# Patient Record
Sex: Female | Born: 1940 | Race: White | Hispanic: No | Smoking: Never smoker
Health system: Southern US, Community
[De-identification: ages and names within clinical notes are randomized; demographics above are authoritative.]

## PROBLEM LIST (undated history)

## (undated) DIAGNOSIS — I471 Supraventricular tachycardia: Secondary | ICD-10-CM

## (undated) DIAGNOSIS — I493 Ventricular premature depolarization: Secondary | ICD-10-CM

## (undated) DIAGNOSIS — M810 Age-related osteoporosis without current pathological fracture: Secondary | ICD-10-CM

## (undated) DIAGNOSIS — H409 Unspecified glaucoma: Secondary | ICD-10-CM

## (undated) DIAGNOSIS — E46 Unspecified protein-calorie malnutrition: Secondary | ICD-10-CM

## (undated) DIAGNOSIS — J309 Allergic rhinitis, unspecified: Secondary | ICD-10-CM

## (undated) DIAGNOSIS — I4719 Other supraventricular tachycardia: Secondary | ICD-10-CM

## (undated) DIAGNOSIS — E119 Type 2 diabetes mellitus without complications: Secondary | ICD-10-CM

## (undated) DIAGNOSIS — M1990 Unspecified osteoarthritis, unspecified site: Secondary | ICD-10-CM

## (undated) DIAGNOSIS — J189 Pneumonia, unspecified organism: Secondary | ICD-10-CM

## (undated) HISTORY — DX: Ventricular premature depolarization: I49.3

## (undated) HISTORY — DX: Age-related osteoporosis without current pathological fracture: M81.0

## (undated) HISTORY — DX: Other supraventricular tachycardia: I47.19

## (undated) HISTORY — DX: Type 2 diabetes mellitus without complications: E11.9

## (undated) HISTORY — PX: TONSILLECTOMY: SUR1361

## (undated) HISTORY — PX: OTHER SURGICAL HISTORY: SHX169

## (undated) HISTORY — DX: Unspecified protein-calorie malnutrition: E46

## (undated) HISTORY — PX: ABDOMINAL HYSTERECTOMY: SHX81

## (undated) HISTORY — DX: Supraventricular tachycardia: I47.1

## (undated) HISTORY — PX: APPENDECTOMY: SHX54

## (undated) HISTORY — PX: PARATHYROIDECTOMY: SHX19

## (undated) HISTORY — DX: Unspecified glaucoma: H40.9

## (undated) HISTORY — DX: Allergic rhinitis, unspecified: J30.9

## (undated) HISTORY — PX: EYE SURGERY: SHX253

---

## 2014-06-18 DIAGNOSIS — E1065 Type 1 diabetes mellitus with hyperglycemia: Secondary | ICD-10-CM | POA: Diagnosis not present

## 2014-06-18 DIAGNOSIS — E780 Pure hypercholesterolemia: Secondary | ICD-10-CM | POA: Diagnosis not present

## 2014-06-18 DIAGNOSIS — E10649 Type 1 diabetes mellitus with hypoglycemia without coma: Secondary | ICD-10-CM | POA: Diagnosis not present

## 2014-06-18 DIAGNOSIS — E1021 Type 1 diabetes mellitus with diabetic nephropathy: Secondary | ICD-10-CM | POA: Diagnosis not present

## 2014-06-18 DIAGNOSIS — Z8639 Personal history of other endocrine, nutritional and metabolic disease: Secondary | ICD-10-CM | POA: Diagnosis not present

## 2014-06-18 DIAGNOSIS — E10319 Type 1 diabetes mellitus with unspecified diabetic retinopathy without macular edema: Secondary | ICD-10-CM | POA: Diagnosis not present

## 2014-07-20 DIAGNOSIS — H04123 Dry eye syndrome of bilateral lacrimal glands: Secondary | ICD-10-CM | POA: Diagnosis not present

## 2014-07-20 DIAGNOSIS — H2513 Age-related nuclear cataract, bilateral: Secondary | ICD-10-CM | POA: Diagnosis not present

## 2014-07-20 DIAGNOSIS — H43813 Vitreous degeneration, bilateral: Secondary | ICD-10-CM | POA: Diagnosis not present

## 2014-07-20 DIAGNOSIS — H18413 Arcus senilis, bilateral: Secondary | ICD-10-CM | POA: Diagnosis not present

## 2014-07-20 DIAGNOSIS — E10329 Type 1 diabetes mellitus with mild nonproliferative diabetic retinopathy without macular edema: Secondary | ICD-10-CM | POA: Diagnosis not present

## 2014-09-07 DIAGNOSIS — E1065 Type 1 diabetes mellitus with hyperglycemia: Secondary | ICD-10-CM | POA: Diagnosis not present

## 2014-09-21 DIAGNOSIS — E10319 Type 1 diabetes mellitus with unspecified diabetic retinopathy without macular edema: Secondary | ICD-10-CM | POA: Diagnosis not present

## 2014-09-21 DIAGNOSIS — E10649 Type 1 diabetes mellitus with hypoglycemia without coma: Secondary | ICD-10-CM | POA: Diagnosis not present

## 2014-09-21 DIAGNOSIS — E1065 Type 1 diabetes mellitus with hyperglycemia: Secondary | ICD-10-CM | POA: Diagnosis not present

## 2014-09-21 DIAGNOSIS — E780 Pure hypercholesterolemia: Secondary | ICD-10-CM | POA: Diagnosis not present

## 2014-09-21 DIAGNOSIS — E1021 Type 1 diabetes mellitus with diabetic nephropathy: Secondary | ICD-10-CM | POA: Diagnosis not present

## 2014-09-21 DIAGNOSIS — Z8639 Personal history of other endocrine, nutritional and metabolic disease: Secondary | ICD-10-CM | POA: Diagnosis not present

## 2014-09-30 DIAGNOSIS — N183 Chronic kidney disease, stage 3 (moderate): Secondary | ICD-10-CM | POA: Diagnosis not present

## 2014-09-30 DIAGNOSIS — Z1239 Encounter for other screening for malignant neoplasm of breast: Secondary | ICD-10-CM | POA: Diagnosis not present

## 2014-09-30 DIAGNOSIS — E780 Pure hypercholesterolemia: Secondary | ICD-10-CM | POA: Diagnosis not present

## 2014-09-30 DIAGNOSIS — E1021 Type 1 diabetes mellitus with diabetic nephropathy: Secondary | ICD-10-CM | POA: Diagnosis not present

## 2014-09-30 DIAGNOSIS — Z8639 Personal history of other endocrine, nutritional and metabolic disease: Secondary | ICD-10-CM | POA: Diagnosis not present

## 2014-10-01 DIAGNOSIS — M810 Age-related osteoporosis without current pathological fracture: Secondary | ICD-10-CM | POA: Diagnosis not present

## 2014-10-01 DIAGNOSIS — Z8639 Personal history of other endocrine, nutritional and metabolic disease: Secondary | ICD-10-CM | POA: Diagnosis not present

## 2014-10-01 DIAGNOSIS — E780 Pure hypercholesterolemia: Secondary | ICD-10-CM | POA: Diagnosis not present

## 2014-10-20 DIAGNOSIS — M810 Age-related osteoporosis without current pathological fracture: Secondary | ICD-10-CM | POA: Diagnosis not present

## 2014-10-20 DIAGNOSIS — Z7983 Long term (current) use of bisphosphonates: Secondary | ICD-10-CM | POA: Diagnosis not present

## 2014-10-20 DIAGNOSIS — Z5181 Encounter for therapeutic drug level monitoring: Secondary | ICD-10-CM | POA: Diagnosis not present

## 2014-10-27 DIAGNOSIS — M810 Age-related osteoporosis without current pathological fracture: Secondary | ICD-10-CM | POA: Diagnosis not present

## 2014-10-27 DIAGNOSIS — Z889 Allergy status to unspecified drugs, medicaments and biological substances status: Secondary | ICD-10-CM | POA: Diagnosis not present

## 2014-11-24 DIAGNOSIS — Z1231 Encounter for screening mammogram for malignant neoplasm of breast: Secondary | ICD-10-CM | POA: Diagnosis not present

## 2014-11-24 DIAGNOSIS — Z9189 Other specified personal risk factors, not elsewhere classified: Secondary | ICD-10-CM | POA: Diagnosis not present

## 2014-11-24 DIAGNOSIS — Z780 Asymptomatic menopausal state: Secondary | ICD-10-CM | POA: Diagnosis not present

## 2014-12-28 DIAGNOSIS — E10649 Type 1 diabetes mellitus with hypoglycemia without coma: Secondary | ICD-10-CM | POA: Diagnosis not present

## 2014-12-28 DIAGNOSIS — Z8639 Personal history of other endocrine, nutritional and metabolic disease: Secondary | ICD-10-CM | POA: Diagnosis not present

## 2014-12-28 DIAGNOSIS — E1065 Type 1 diabetes mellitus with hyperglycemia: Secondary | ICD-10-CM | POA: Diagnosis not present

## 2014-12-28 DIAGNOSIS — E1021 Type 1 diabetes mellitus with diabetic nephropathy: Secondary | ICD-10-CM | POA: Diagnosis not present

## 2014-12-28 DIAGNOSIS — E10329 Type 1 diabetes mellitus with mild nonproliferative diabetic retinopathy without macular edema: Secondary | ICD-10-CM | POA: Diagnosis not present

## 2014-12-28 DIAGNOSIS — E780 Pure hypercholesterolemia: Secondary | ICD-10-CM | POA: Diagnosis not present

## 2015-01-04 DIAGNOSIS — H2513 Age-related nuclear cataract, bilateral: Secondary | ICD-10-CM | POA: Diagnosis not present

## 2015-01-04 DIAGNOSIS — E10329 Type 1 diabetes mellitus with mild nonproliferative diabetic retinopathy without macular edema: Secondary | ICD-10-CM | POA: Diagnosis not present

## 2015-01-04 DIAGNOSIS — H52223 Regular astigmatism, bilateral: Secondary | ICD-10-CM | POA: Diagnosis not present

## 2015-01-04 DIAGNOSIS — H04123 Dry eye syndrome of bilateral lacrimal glands: Secondary | ICD-10-CM | POA: Diagnosis not present

## 2015-03-30 DIAGNOSIS — E7800 Pure hypercholesterolemia, unspecified: Secondary | ICD-10-CM | POA: Diagnosis not present

## 2015-03-30 DIAGNOSIS — Z8639 Personal history of other endocrine, nutritional and metabolic disease: Secondary | ICD-10-CM | POA: Diagnosis not present

## 2015-03-30 DIAGNOSIS — E103293 Type 1 diabetes mellitus with mild nonproliferative diabetic retinopathy without macular edema, bilateral: Secondary | ICD-10-CM | POA: Diagnosis not present

## 2015-03-30 DIAGNOSIS — E1065 Type 1 diabetes mellitus with hyperglycemia: Secondary | ICD-10-CM | POA: Diagnosis not present

## 2015-03-30 DIAGNOSIS — E1021 Type 1 diabetes mellitus with diabetic nephropathy: Secondary | ICD-10-CM | POA: Diagnosis not present

## 2015-03-30 DIAGNOSIS — E10649 Type 1 diabetes mellitus with hypoglycemia without coma: Secondary | ICD-10-CM | POA: Diagnosis not present

## 2015-04-05 DIAGNOSIS — Z8639 Personal history of other endocrine, nutritional and metabolic disease: Secondary | ICD-10-CM | POA: Diagnosis not present

## 2015-04-05 DIAGNOSIS — N183 Chronic kidney disease, stage 3 (moderate): Secondary | ICD-10-CM | POA: Diagnosis not present

## 2015-04-05 DIAGNOSIS — E7800 Pure hypercholesterolemia, unspecified: Secondary | ICD-10-CM | POA: Diagnosis not present

## 2015-04-05 DIAGNOSIS — M810 Age-related osteoporosis without current pathological fracture: Secondary | ICD-10-CM | POA: Diagnosis not present

## 2015-04-05 DIAGNOSIS — E1021 Type 1 diabetes mellitus with diabetic nephropathy: Secondary | ICD-10-CM | POA: Diagnosis not present

## 2015-07-02 DIAGNOSIS — E7800 Pure hypercholesterolemia, unspecified: Secondary | ICD-10-CM | POA: Diagnosis not present

## 2015-07-02 DIAGNOSIS — E1021 Type 1 diabetes mellitus with diabetic nephropathy: Secondary | ICD-10-CM | POA: Diagnosis not present

## 2015-07-02 DIAGNOSIS — E10649 Type 1 diabetes mellitus with hypoglycemia without coma: Secondary | ICD-10-CM | POA: Diagnosis not present

## 2015-07-02 DIAGNOSIS — E1065 Type 1 diabetes mellitus with hyperglycemia: Secondary | ICD-10-CM | POA: Diagnosis not present

## 2015-07-02 DIAGNOSIS — Z8639 Personal history of other endocrine, nutritional and metabolic disease: Secondary | ICD-10-CM | POA: Diagnosis not present

## 2015-07-02 DIAGNOSIS — E103293 Type 1 diabetes mellitus with mild nonproliferative diabetic retinopathy without macular edema, bilateral: Secondary | ICD-10-CM | POA: Diagnosis not present

## 2015-10-04 DIAGNOSIS — N183 Chronic kidney disease, stage 3 (moderate): Secondary | ICD-10-CM | POA: Diagnosis not present

## 2015-10-04 DIAGNOSIS — M810 Age-related osteoporosis without current pathological fracture: Secondary | ICD-10-CM | POA: Diagnosis not present

## 2015-10-04 DIAGNOSIS — E7800 Pure hypercholesterolemia, unspecified: Secondary | ICD-10-CM | POA: Diagnosis not present

## 2015-10-04 DIAGNOSIS — E103293 Type 1 diabetes mellitus with mild nonproliferative diabetic retinopathy without macular edema, bilateral: Secondary | ICD-10-CM | POA: Diagnosis not present

## 2015-10-04 DIAGNOSIS — E1021 Type 1 diabetes mellitus with diabetic nephropathy: Secondary | ICD-10-CM | POA: Diagnosis not present

## 2015-10-04 DIAGNOSIS — Z8639 Personal history of other endocrine, nutritional and metabolic disease: Secondary | ICD-10-CM | POA: Diagnosis not present

## 2015-10-05 DIAGNOSIS — Z8639 Personal history of other endocrine, nutritional and metabolic disease: Secondary | ICD-10-CM | POA: Diagnosis not present

## 2015-10-05 DIAGNOSIS — E7800 Pure hypercholesterolemia, unspecified: Secondary | ICD-10-CM | POA: Diagnosis not present

## 2015-10-05 DIAGNOSIS — E1065 Type 1 diabetes mellitus with hyperglycemia: Secondary | ICD-10-CM | POA: Diagnosis not present

## 2015-10-05 DIAGNOSIS — E10649 Type 1 diabetes mellitus with hypoglycemia without coma: Secondary | ICD-10-CM | POA: Diagnosis not present

## 2015-10-05 DIAGNOSIS — E103293 Type 1 diabetes mellitus with mild nonproliferative diabetic retinopathy without macular edema, bilateral: Secondary | ICD-10-CM | POA: Diagnosis not present

## 2015-10-05 DIAGNOSIS — E1021 Type 1 diabetes mellitus with diabetic nephropathy: Secondary | ICD-10-CM | POA: Diagnosis not present

## 2015-12-24 DIAGNOSIS — E109 Type 1 diabetes mellitus without complications: Secondary | ICD-10-CM | POA: Diagnosis not present

## 2016-01-05 DIAGNOSIS — H40033 Anatomical narrow angle, bilateral: Secondary | ICD-10-CM | POA: Diagnosis not present

## 2016-01-05 DIAGNOSIS — H2513 Age-related nuclear cataract, bilateral: Secondary | ICD-10-CM | POA: Diagnosis not present

## 2016-01-17 DIAGNOSIS — H40033 Anatomical narrow angle, bilateral: Secondary | ICD-10-CM | POA: Diagnosis not present

## 2016-01-23 ENCOUNTER — Emergency Department (HOSPITAL_COMMUNITY)
Admission: EM | Admit: 2016-01-23 | Discharge: 2016-01-24 | Disposition: A | Discharge status: Home / Self Care | Payer: Medicare Other | Attending: Emergency Medicine | Admitting: Emergency Medicine

## 2016-01-23 ENCOUNTER — Emergency Department (HOSPITAL_COMMUNITY): Payer: Medicare Other

## 2016-01-23 ENCOUNTER — Encounter (HOSPITAL_COMMUNITY): Payer: Self-pay | Admitting: Emergency Medicine

## 2016-01-23 DIAGNOSIS — Y929 Unspecified place or not applicable: Secondary | ICD-10-CM | POA: Insufficient documentation

## 2016-01-23 DIAGNOSIS — Z23 Encounter for immunization: Secondary | ICD-10-CM | POA: Diagnosis not present

## 2016-01-23 DIAGNOSIS — W01198A Fall on same level from slipping, tripping and stumbling with subsequent striking against other object, initial encounter: Secondary | ICD-10-CM | POA: Diagnosis not present

## 2016-01-23 DIAGNOSIS — S62613A Displaced fracture of proximal phalanx of left middle finger, initial encounter for closed fracture: Secondary | ICD-10-CM | POA: Diagnosis not present

## 2016-01-23 DIAGNOSIS — S82034A Nondisplaced transverse fracture of right patella, initial encounter for closed fracture: Secondary | ICD-10-CM | POA: Diagnosis not present

## 2016-01-23 DIAGNOSIS — Y999 Unspecified external cause status: Secondary | ICD-10-CM | POA: Insufficient documentation

## 2016-01-23 DIAGNOSIS — Y9301 Activity, walking, marching and hiking: Secondary | ICD-10-CM | POA: Diagnosis not present

## 2016-01-23 DIAGNOSIS — S0081XA Abrasion of other part of head, initial encounter: Secondary | ICD-10-CM | POA: Diagnosis not present

## 2016-01-23 DIAGNOSIS — S82001A Unspecified fracture of right patella, initial encounter for closed fracture: Secondary | ICD-10-CM

## 2016-01-23 DIAGNOSIS — Z794 Long term (current) use of insulin: Secondary | ICD-10-CM | POA: Diagnosis not present

## 2016-01-23 DIAGNOSIS — E119 Type 2 diabetes mellitus without complications: Secondary | ICD-10-CM | POA: Insufficient documentation

## 2016-01-23 DIAGNOSIS — S62639A Displaced fracture of distal phalanx of unspecified finger, initial encounter for closed fracture: Secondary | ICD-10-CM

## 2016-01-23 DIAGNOSIS — S8991XA Unspecified injury of right lower leg, initial encounter: Secondary | ICD-10-CM | POA: Diagnosis present

## 2016-01-23 DIAGNOSIS — M25562 Pain in left knee: Secondary | ICD-10-CM | POA: Diagnosis not present

## 2016-01-23 DIAGNOSIS — S82091A Other fracture of right patella, initial encounter for closed fracture: Secondary | ICD-10-CM | POA: Diagnosis not present

## 2016-01-23 DIAGNOSIS — S0990XA Unspecified injury of head, initial encounter: Secondary | ICD-10-CM | POA: Diagnosis not present

## 2016-01-23 DIAGNOSIS — T148 Other injury of unspecified body region: Secondary | ICD-10-CM | POA: Diagnosis not present

## 2016-01-23 HISTORY — DX: Type 2 diabetes mellitus without complications: E11.9

## 2016-01-23 MED ORDER — MORPHINE SULFATE (PF) 2 MG/ML IV SOLN: 2.0000 mg | Freq: Once | INTRAVENOUS | Status: DC

## 2016-01-23 MED ORDER — MORPHINE SULFATE (PF) 4 MG/ML IV SOLN
4.0000 mg | Freq: Once | INTRAVENOUS | Status: AC
  Administered 2016-01-23: 4 mg via INTRAMUSCULAR
  Filled 2016-01-23: qty 1

## 2016-01-23 MED ORDER — TETANUS-DIPHTH-ACELL PERTUSSIS 5-2.5-18.5 LF-MCG/0.5 IM SUSP
0.5000 mL | Freq: Once | INTRAMUSCULAR | Status: AC
  Administered 2016-01-23: 0.5 mL via INTRAMUSCULAR
  Filled 2016-01-23: qty 0.5

## 2016-01-23 NOTE — ED Triage Notes (Signed)
Pt was walking dog and tripped. Striking both knees hand. Right knee swollen and immobilized. Abrasions to left knee and left eye

## 2016-01-23 NOTE — ED Notes (Signed)
Ortho tech paged  

## 2016-01-23 NOTE — ED Notes (Signed)
[employer] paged for knee brace

## 2016-01-23 NOTE — ED Provider Notes (Signed)
[facility] DEPT Provider Note   CSN: [id number] Arrival date & time: 01/23/16  2018     History   Chief Complaint Chief Complaint  Patient presents with  . Fall    HPI Kendra Harper is a 75 y.o. female.  HPI  Past Medical History:  Diagnosis Date  . Diabetes mellitus without complication ([place])     There are no active problems to display for this patient.   Past Surgical History:  Procedure Laterality Date  . ABDOMINAL HYSTERECTOMY    . APPENDECTOMY    . EYE SURGERY    . heel surgery Bilateral   . TONSILLECTOMY      OB History    No data available       Home Medications    Prior to Admission medications   Medication Sig Start Date End Date Taking? Authorizing Provider  Insulin Human (INSULIN PUMP) SOLN Inject into the skin daily. 10/1 carb ratio  humalog insulin   Yes Historical Provider, MD  HYDROcodone-acetaminophen (NORCO/VICODIN) 5-325 MG tablet Take 1 tablet by mouth every 4 (four) hours as needed. 01/24/16   Dorie Rank, MD    Family History No family history on file.  Social History Social History  Substance Use Topics  . Smoking status: Never Smoker  . Smokeless tobacco: Never Used  . Alcohol use No     Allergies   Lyrica [pregabalin]   Review of Systems Review of Systems  Constitutional: Negative for chills and fever.  Respiratory: Negative for shortness of breath.   Cardiovascular: Negative for chest pain.  Gastrointestinal: Negative for nausea and vomiting.  Musculoskeletal: Positive for joint swelling. Negative for back pain, neck pain and neck stiffness.  Skin: Positive for wound.  Neurological: Negative for dizziness, weakness, light-headedness and numbness.  All other systems reviewed and are negative.    Physical Exam Updated Vital Signs BP 124/85 (BP Location: Right Arm)   Pulse 74   Temp 98.8 F (37.1 C) (Oral)   Resp 16   Ht 5\' 1"  (1.549 m)   Wt 45.8 kg   SpO2 99%   BMI 19.08 kg/m   Physical Exam    Constitutional: She appears well-developed and well-nourished. No distress.  HENT:  Head: Normocephalic and atraumatic.  Eyes: Conjunctivae are normal.  Neck: Neck supple.  Cardiovascular: Normal rate and regular rhythm.   No murmur heard. Pulmonary/Chest: Effort normal and breath sounds normal. No respiratory distress.  Abdominal: Soft. There is no tenderness.  Musculoskeletal: Normal range of motion. She exhibits edema, tenderness and deformity.  Neurological: She is alert. No cranial nerve deficit.  Skin: Skin is warm and dry.  Psychiatric: She has a normal mood and affect.  Nursing note and vitals reviewed.    ED Treatments / Results  Labs (all labs ordered are listed, but only abnormal results are displayed) Labs Reviewed - No data to display  EKG  EKG Interpretation None       Radiology Ct Head Wo Contrast  Result Date: 01/23/2016 CLINICAL DATA:  [place] while walking the dog this evening, abrasion to LEFT side of temple, no loss of consciousness, history diabetes mellitus EXAM: CT HEAD WITHOUT CONTRAST TECHNIQUE: Contiguous axial images were obtained from the base of the skull through the vertex without intravenous contrast. COMPARISON:  None FINDINGS: Age-related atrophy. Normal ventricular morphology. No midline shift or mass effect. Otherwise normal appearance of brain parenchyma. No intracranial hemorrhage, mass lesion, evidence of acute infarction or extra-axial fluid collection. Bones and sinuses unremarkable. IMPRESSION: No  acute intracranial abnormalities. Electronically Signed   By: Lavonia Dana M.D.   On: 01/23/2016 23:26   Dg Knee Complete 4 Views Right  Result Date: 01/23/2016 CLINICAL DATA:  [place] this evening, pain in RIGHT knee at patella, initial encounter EXAM: RIGHT KNEE - COMPLETE 4+ VIEW COMPARISON:  Non FINDINGS: Diffuse osseous demineralization. Joint spaces preserved. Transverse nondisplaced fracture RIGHT patella. Associated knee joint effusion and  prepatellar soft tissue swelling. No additional fracture, dislocation, or bone destruction. IMPRESSION: Transverse nondisplaced fracture of the RIGHT patella. Electronically Signed   By: Lavonia Dana M.D.   On: 01/23/2016 22:24   Dg Finger Middle Left  Result Date: 01/23/2016 CLINICAL DATA:  Pain in LEFT middle finger post fall EXAM: LEFT MIDDLE FINGER 2+V COMPARISON:  None FINDINGS: Osseous demineralization. Minimal narrowing of IP joints. Oblique fracture through shaft of proximal phalanx LEFT middle finger, not significantly displaced. No articular extension. No additional fracture or dislocation. IMPRESSION: Oblique fracture at shaft of proximal phalanx LEFT middle finger. Electronically Signed   By: Lavonia Dana M.D.   On: 01/23/2016 23:39    Procedures Procedures (including critical care time)  Medications Ordered in ED Medications  morphine 4 MG/ML injection 4 mg (4 mg Intramuscular Given 01/23/16 2250)  Tdap (BOOSTRIX) injection 0.5 mL (0.5 mLs Intramuscular Given 01/23/16 2341)     Initial Impression / Assessment and Plan / ED Course  I have reviewed the triage vital signs and the nursing notes.  Pertinent labs & imaging results that were available during my care of the patient were reviewed by me and considered in my medical decision making (see chart for details).  Clinical Course    Patient is a 75 year old female past medical history of diabetes who comes in today after a mechanical fall. Patient was walking her dog down the sidewalk when the dog ran in front of her causing her to lose her footing and fell forward. Patient complains of right knee pain left middle finger pain as well as an abrasion to the left lateral eyebrow. Patient denies changes in vision headache nausea vomiting loss of consciousness. Patient is ambulatory following the event.  Physical exam patient with obvious swelling and deformity to the right knee. Pt w/ full ROM. Patient with swelling to the PIP joint of  the middle finger of the left hand. Patient has an old injury which has resulted in the diversion of the DIP joint however this is not acute. Pt neurovascularly intact. Patient has a small abrasion to the lateral aspect of the left eyebrow. Remainder physical exam the normal limits.  We'll collect x-ray of the knee as well as the finger and CT head. Pt found to have finger and patella fx. Pt placed in knee brace and given appropriate f/u instructions with ortho. Pt agreeable to discharge at this time.    Final Clinical Impressions(s) / ED Diagnoses   Final diagnoses:  Patella fracture, right, closed, initial encounter  Phalanx, distal fracture of finger, closed, initial encounter    New Prescriptions Discharge Medication List as of 01/24/2016 12:31 AM    START taking these medications   Details  HYDROcodone-acetaminophen (NORCO/VICODIN) 5-325 MG tablet Take 1 tablet by mouth every 4 (four) hours as needed., Starting Mon 01/24/2016, Print         Chapman Moss, MD 01/24/16 [place], MD 01/25/16 2109

## 2016-01-23 NOTE — ED Notes (Signed)
[employer] to come in to place hinged knee brace.

## 2016-01-24 MED ORDER — HYDROCODONE-ACETAMINOPHEN 5-325 MG PO TABS: 1.0000 | ORAL_TABLET | ORAL | 0 refills | Status: DC | PRN

## 2016-01-24 NOTE — ED Notes (Signed)
[employer] applied hinge knee brace to right knee

## 2016-01-24 NOTE — ED Notes (Signed)
Pt understood dc material. NAD Noted. Scripts given at dc. 

## 2016-01-24 NOTE — ED Provider Notes (Signed)
Pt is a 75 y.o. female who presents with  Chief Complaint  Patient presents with  . Fall  Patient was walking her dog when she tripped and fell striking her hands and knees on the ground.  Physical Exam  Constitutional: No distress.  HENT:  Head: Normocephalic and atraumatic.  Eyes: Conjunctivae are normal. Left eye exhibits no discharge. No scleral icterus.  Neck: No tracheal deviation present. No thyromegaly present.  Pulmonary/Chest: Effort normal and breath sounds normal. No stridor.  Abdominal: She exhibits no distension.  Musculoskeletal: She exhibits no deformity.       Right knee: Tenderness found.       Left hand: She exhibits tenderness (middle finger) and bony tenderness.  Neurological: She is alert.  Skin: Skin is warm. No rash noted. She is not diaphoretic. No erythema.  Psychiatric: Affect normal.    Clinical Course  X-rays show a finger fracture and a patella fracture. An immobilizer was ordered for the patient. Patient was also given crutches. Discharge home with outpatient orthopedic follow-up.    1. Patella fracture, right, closed, initial encounter   2. Phalanx, distal fracture of finger, closed, initial encounter     I saw and evaluated the patient, reviewed the resident's note and I agree with the findings and plan.    Dorie Rank, MD 01/24/16 [phone]

## 2016-01-25 ENCOUNTER — Encounter: Payer: Medicare Other | Attending: Endocrinology | Admitting: Nutrition

## 2016-01-25 ENCOUNTER — Encounter: Payer: Self-pay | Admitting: Endocrinology

## 2016-01-25 ENCOUNTER — Ambulatory Visit (INDEPENDENT_AMBULATORY_CARE_PROVIDER_SITE_OTHER): Payer: Medicare Other | Admitting: Endocrinology

## 2016-01-25 DIAGNOSIS — E119 Type 2 diabetes mellitus without complications: Secondary | ICD-10-CM | POA: Insufficient documentation

## 2016-01-25 DIAGNOSIS — Z713 Dietary counseling and surveillance: Secondary | ICD-10-CM | POA: Insufficient documentation

## 2016-01-25 DIAGNOSIS — S82044A Nondisplaced comminuted fracture of right patella, initial encounter for closed fracture: Secondary | ICD-10-CM | POA: Diagnosis not present

## 2016-01-25 DIAGNOSIS — S82041A Displaced comminuted fracture of right patella, initial encounter for closed fracture: Secondary | ICD-10-CM | POA: Diagnosis not present

## 2016-01-25 DIAGNOSIS — E109 Type 1 diabetes mellitus without complications: Secondary | ICD-10-CM | POA: Diagnosis not present

## 2016-01-25 DIAGNOSIS — E10649 Type 1 diabetes mellitus with hypoglycemia without coma: Secondary | ICD-10-CM | POA: Diagnosis not present

## 2016-01-25 DIAGNOSIS — S82001A Unspecified fracture of right patella, initial encounter for closed fracture: Secondary | ICD-10-CM | POA: Diagnosis not present

## 2016-01-25 LAB — POCT GLYCOSYLATED HEMOGLOBIN (HGB A1C): Hemoglobin A1C: 6.4

## 2016-01-25 NOTE — Patient Instructions (Addendum)
good diet and exercise significantly improve the control of your diabetes.  please let me know if you wish to be referred to a dietician.  high blood sugar is very risky to your health.  you should see an eye doctor and dentist every year.  It is very important to get all recommended vaccinations.   Controlling your blood pressure and cholesterol drastically reduces the damage diabetes does to your body.  Those who smoke should quit.  Please discuss these with your doctor.  check your blood sugar 8 times a day: before the 3 meals, and at bedtime.  also check if you have symptoms of your blood sugar being too high or too low.  please keep a record of the readings and bring it to your next appointment here (or you can bring the meter itself).  You can write it on any piece of paper.  please call us sooner if your blood sugar goes below 70, or if you have a lot of readings over 200.   Please take these pump settings:  basal rate of 0.2 units/hr, except for 0.6 units/hr, 3 am to 6 am.   mealtime bolus of 1 unit/12 grams carbohydrate midnight-10 AM, and 1 unit/14 grams at other times.   correction bolus (which some people call "sensitivity," or "insulin sensitivity ratio," or just "isr") of 1 unit for each 57 by which your glucose exceeds 100.   Please come back for a follow-up appointment in 2 months.

## 2016-01-25 NOTE — Progress Notes (Signed)
Subjective:    Patient ID: Kendra Harper, female    DOB: [birth year], 75 y.o.   MRN: [id number]  HPI pt states DM was dx'ed in 1968; she has mild if any neuropathy of the lower extremities; she is unaware of any associated chronic complications; she has been on insulin since dx, and pump rx (medtronic paradigm) since 2002; pt says her diet is good, but exercise is limited by a recent right patellar fx; she has never had GDM (G0), pancreatitis, or DKA.  She has had multiple episodes of severe hypoglycemia (most recently last week).  This usually happens in the middle of the night (prior to the scheduled increase in basal), or in the afternoon.  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  pt states she otherwise feels well in general.   She uses these pump settings: Basal of: 0.3 units/HR midnight-3 AM 0.6 units/HR, 3 AM-7 AM 0.3 units/HR 7 AM-noon 0.275 units/HR noon-midnight mealtime bolus of 1 unit/12 grams carbohydrate midnight-10 AM, and 1 unit/14 grams at other times. correction bolus (which some people call "sensitivity," or "insulin sensitivity ratio," or just "isr") of 1 unit for each 57 by which your glucose exceeds 100.   She averages a total of approx 25 units per day.   Past Medical History:  Diagnosis Date  . Diabetes ([place])   . Diabetes mellitus without complication [facility])     Past Surgical History:  Procedure Laterality Date  . ABDOMINAL HYSTERECTOMY    . APPENDECTOMY    . EYE SURGERY    . heel surgery Bilateral   . TONSILLECTOMY      Social History   Social History  . Marital status: Widowed    Spouse name: N/A  . Number of children: N/A  . Years of education: N/A   Occupational History  . Not on file.   Social History Main Topics  . Smoking status: Never Smoker  . Smokeless tobacco: Never Used  . Alcohol use No  . Drug use: No  . Sexual activity: Not on file   Other Topics Concern  . Not on file   Social History Narrative  . No narrative on file     Current Outpatient Prescriptions on File Prior to Visit  Medication Sig Dispense Refill  . Insulin Human (INSULIN PUMP) SOLN Inject into the skin daily. 10/1 carb ratio  humalog insulin     No current facility-administered medications on file prior to visit.     Allergies  Allergen Reactions  . Lyrica [Pregabalin] Other (See Comments)    suicidal     Family History  Problem Relation Age of Onset  . Diabetes Father   . Diabetes Brother     type 1    BP 132/70   Pulse 62   Ht 5\' 1"  (1.549 m)   Wt 101 lb (45.8 kg)   BMI 19.08 kg/m   Review of Systems denies weight loss, blurry vision, headache, chest pain, sob, n/v, urinary frequency, muscle cramps, excessive diaphoresis, memory loss, cold intolerance, rhinorrhea, and easy bruising.      Objective:   Physical Exam VS: see vs page GEN: no distress HEAD: head: no deformity eyes: no periorbital swelling, no proptosis external nose and ears are normal mouth: no lesion seen NECK: supple, thyroid is not enlarged.   CHEST WALL: no deformity LUNGS: clear to auscultation CV: reg rate and rhythm, no murmur ABD: abdomen is soft, nontender.  no hepatosplenomegaly.  not distended.  no hernia MUSCULOSKELETAL: muscle bulk  and strength are grossly normal.  no obvious joint swelling.  gait is normal and steady EXTEMITIES: no deformity.  no ulcer on the feet.  feet are of normal color and temp.  no edema.  Right leg is in a brace.   PULSES: dorsalis pedis intact bilat.  no carotid bruit. NEURO:  cn 2-12 grossly intact.   readily moves all 4's.  sensation is intact to touch on the feet.   SKIN:  Normal texture and temperature.  No rash or suspicious lesion is visible.   NODES:  None palpable at the neck.  PSYCH: alert, well-oriented.  Does not appear anxious nor depressed.     A1c=6.4%  I have reviewed outside records, and summarized: Pt was seen in ER 2 days ago, with patellar fracture.       Assessment & Plan:  Type 1  DM: overcontrolled.  Severe hypoglycemia. Patellar fracture: this limits exercise rx.  When exercise improves, she may need a further reduction of her insulin.

## 2016-01-26 NOTE — Patient Instructions (Signed)
Treat low blood sugars with 15 grams of fast acting carbohydrates--1/2 cup juice, 6 jelly beans, 1/2 cup soda, 3 tsp. Of sugar in water, or 3 pieces of hard candy.   Review how to change basal rates and carbohydrate settings, and call if questions.

## 2016-01-26 NOTE — Assessment & Plan Note (Addendum)
This is a new patient that is here for unrecognized  low blood sugars.  She is wearing a medtronic523 insulin pump.  She does not know how to bring up/change her basal rates, or what her I/C ratio is, or ISF.   She was shown how to change/find her pump settings.  She was helped in changing her basal rates to 0.2u/hr except for 3AM to 7AM.   I explained what the I/C ratio was, and the ISF, and how that was used to calculate her meal time insulin bolus.  We also discussed IOB, which she did not know what this was, and how to determine how much carbs to eat, for each unit of insulin of IOB she had when blood sugar is low.   She is treating her low blood sugars with Oreo cookies and whole milk.  I explained that fat slows the blood sugar rise down and encouraged her to take in juices, or glucose tablets.  She sometimes eats 6 jelly beans when she is out-which is approx. 15 grams of carb.   We also discussed how to get her symptoms back when she is low, and that we are going to have her FBSs in the 130s for 2-3 weeks for this.  She reported good understanding of this and had no final questions.

## 2016-01-27 ENCOUNTER — Other Ambulatory Visit: Payer: Self-pay

## 2016-01-27 MED ORDER — GLUCOSE BLOOD VI STRP: ORAL_STRIP | 5 refills | Status: DC

## 2016-02-08 DIAGNOSIS — S82041D Displaced comminuted fracture of right patella, subsequent encounter for closed fracture with routine healing: Secondary | ICD-10-CM | POA: Diagnosis not present

## 2016-02-11 ENCOUNTER — Telehealth: Payer: Self-pay | Admitting: Endocrinology

## 2016-02-11 MED ORDER — INSULIN PUMP: SUBCUTANEOUS | 2 refills | Status: DC

## 2016-02-11 NOTE — Telephone Encounter (Signed)
Patient need a refill of Insulin Human (INSULIN PUMP) SOLN  [facility] - [place], [place] [phone] (Phone) [phone] (Fax)

## 2016-02-11 NOTE — Telephone Encounter (Signed)
Refill submitted per patient's request.  

## 2016-02-29 DIAGNOSIS — S82044D Nondisplaced comminuted fracture of right patella, subsequent encounter for closed fracture with routine healing: Secondary | ICD-10-CM | POA: Diagnosis not present

## 2016-03-13 DIAGNOSIS — H40033 Anatomical narrow angle, bilateral: Secondary | ICD-10-CM | POA: Diagnosis not present

## 2016-03-14 DIAGNOSIS — S82041D Displaced comminuted fracture of right patella, subsequent encounter for closed fracture with routine healing: Secondary | ICD-10-CM | POA: Diagnosis not present

## 2016-03-22 DIAGNOSIS — M25661 Stiffness of right knee, not elsewhere classified: Secondary | ICD-10-CM | POA: Diagnosis not present

## 2016-03-22 DIAGNOSIS — R262 Difficulty in walking, not elsewhere classified: Secondary | ICD-10-CM | POA: Diagnosis not present

## 2016-03-22 DIAGNOSIS — S82001S Unspecified fracture of right patella, sequela: Secondary | ICD-10-CM | POA: Diagnosis not present

## 2016-03-22 DIAGNOSIS — S8991XD Unspecified injury of right lower leg, subsequent encounter: Secondary | ICD-10-CM | POA: Diagnosis not present

## 2016-03-22 DIAGNOSIS — R29898 Other symptoms and signs involving the musculoskeletal system: Secondary | ICD-10-CM | POA: Diagnosis not present

## 2016-03-27 ENCOUNTER — Encounter: Payer: Self-pay | Admitting: Endocrinology

## 2016-03-27 ENCOUNTER — Ambulatory Visit (INDEPENDENT_AMBULATORY_CARE_PROVIDER_SITE_OTHER): Payer: Medicare Other | Admitting: Endocrinology

## 2016-03-27 ENCOUNTER — Telehealth: Payer: Self-pay | Admitting: Endocrinology

## 2016-03-27 VITALS — BP 112/62 | HR 85 | Ht 61.0 in | Wt 101.0 lb

## 2016-03-27 DIAGNOSIS — H52221 Regular astigmatism, right eye: Secondary | ICD-10-CM | POA: Diagnosis not present

## 2016-03-27 DIAGNOSIS — H40013 Open angle with borderline findings, low risk, bilateral: Secondary | ICD-10-CM | POA: Diagnosis not present

## 2016-03-27 DIAGNOSIS — H1859 Other hereditary corneal dystrophies: Secondary | ICD-10-CM | POA: Diagnosis not present

## 2016-03-27 DIAGNOSIS — E109 Type 1 diabetes mellitus without complications: Secondary | ICD-10-CM | POA: Diagnosis not present

## 2016-03-27 DIAGNOSIS — E10649 Type 1 diabetes mellitus with hypoglycemia without coma: Secondary | ICD-10-CM

## 2016-03-27 DIAGNOSIS — H2513 Age-related nuclear cataract, bilateral: Secondary | ICD-10-CM | POA: Diagnosis not present

## 2016-03-27 LAB — POCT GLYCOSYLATED HEMOGLOBIN (HGB A1C): HEMOGLOBIN A1C: 6.7

## 2016-03-27 NOTE — Progress Notes (Signed)
Subjective:    Patient ID: Kendra Harper, female    DOB: Mar 25, 1941, 75 y.o.   MRN: [id number]  HPI  Pt returns for f/u of diabetes mellitus: DM type: 1 Dx'ed: 99991111 Complications: none Therapy: insulin since dx GDM: G0 DKA: never Severe hypoglycemia: multiple episodes (most recently in 2017) Pancreatitis: never Other: she has been on pump rx (medtronic paradigm) since 2002; she has hypoglycemia unawareness. Interval history: She uses these pump settings: basal rate of 0.2 units/hr, except for 0.6 units/hr, 3 am to 6 am.   mealtime bolus of 1 unit/12 grams carbohydrate midnight-10 AM, and 1 unit/14 grams at other times.   correction bolus (which some people call "sensitivity," or "insulin sensitivity ratio," or just "isr") of 1 unit for each 57 by which your glucose exceeds 100.   She has mild hypoglycemia approx QOD.  This usually happen with activity.   no cbg record, but states cbg's are highest fasting.  She does not know her total daily activity.   Past Medical History:  Diagnosis Date  . Diabetes ([place])   . Diabetes mellitus without complication [facility])     Past Surgical History:  Procedure Laterality Date  . ABDOMINAL HYSTERECTOMY    . APPENDECTOMY    . EYE SURGERY    . heel surgery Bilateral   . TONSILLECTOMY      Social History   Social History  . Marital status: Widowed    Spouse name: N/A  . Number of children: N/A  . Years of education: N/A   Occupational History  . Not on file.   Social History Main Topics  . Smoking status: Never Smoker  . Smokeless tobacco: Never Used  . Alcohol use No  . Drug use: No  . Sexual activity: Not on file   Other Topics Concern  . Not on file   Social History Narrative  . No narrative on file    Current Outpatient Prescriptions on File Prior to Visit  Medication Sig Dispense Refill  . glucose blood (ONE TOUCH ULTRA TEST) test strip Use to check blood sugar 7 times per day. 250 each 5  . Insulin Human (INSULIN  PUMP) SOLN 10/1 carb ratiohumalog insulin 10 each 2  . insulin lispro (HUMALOG) 100 UNIT/ML injection Use as directed.     No current facility-administered medications on file prior to visit.     Allergies  Allergen Reactions  . Lyrica [Pregabalin] Other (See Comments)    suicidal     Family History  Problem Relation Age of Onset  . Diabetes Father   . Diabetes Brother     type 1    BP 112/62   Pulse 85   Ht 5\' 1"  (1.549 m)   Wt 101 lb (45.8 kg)   SpO2 98%   BMI 19.08 kg/m   Review of Systems Denies LOC    Objective:   Physical Exam VITAL SIGNS:  See vs page GENERAL: no distress Pulses: dorsalis pedis intact bilat.   MSK: no deformity of the feet CV: no leg edema Skin:  no ulcer on the feet.  normal color and temp on the feet. Neuro: sensation is intact to touch on the feet  A1c=6.7%    Assessment & Plan:  Type 1 DM: overcontrolled.  Patient is advised the following: Patient Instructions  check your blood sugar 8 times a day: before the 3 meals, and at bedtime.  also check if you have symptoms of your blood sugar being too high or  too low.  please keep a record of the readings and bring it to your next appointment here (or you can bring the meter itself).  You can write it on any piece of paper.  please call us sooner if your blood sugar goes below 70, or if you have a lot of readings over 200.   Please take these pump settings:  basal rate of 0.2 units/hr, except for 0.6 units/hr, 3 am to 6 am, and suspend for 1-2 hrs, for activity.   mealtime bolus of 1 unit/12 grams carbohydrate midnight-10 AM, and 1 unit/14 grams at other times.   correction bolus (which some people call "sensitivity," or "insulin sensitivity ratio," or just "isr") of 1 unit for each 57 by which your glucose exceeds 100.   Please come back for a follow-up appointment in 3 months.

## 2016-03-27 NOTE — Patient Instructions (Addendum)
check your blood sugar 8 times a day: before the 3 meals, and at bedtime.  also check if you have symptoms of your blood sugar being too high or too low.  please keep a record of the readings and bring it to your next appointment here (or you can bring the meter itself).  You can write it on any piece of paper.  please call us sooner if your blood sugar goes below 70, or if you have a lot of readings over 200.   Please take these pump settings:  basal rate of 0.2 units/hr, except for 0.6 units/hr, 3 am to 6 am, and suspend for 1-2 hrs, for activity.   mealtime bolus of 1 unit/12 grams carbohydrate midnight-10 AM, and 1 unit/14 grams at other times.   correction bolus (which some people call "sensitivity," or "insulin sensitivity ratio," or just "isr") of 1 unit for each 57 by which your glucose exceeds 100.   Please come back for a follow-up appointment in 3 months.

## 2016-03-27 NOTE — Telephone Encounter (Signed)
FYI:  Pt uses the [facility] in [place], [place] for her infusion sets for the pump

## 2016-03-28 DIAGNOSIS — M25661 Stiffness of right knee, not elsewhere classified: Secondary | ICD-10-CM | POA: Diagnosis not present

## 2016-03-28 DIAGNOSIS — M25461 Effusion, right knee: Secondary | ICD-10-CM | POA: Diagnosis not present

## 2016-03-28 DIAGNOSIS — S82001S Unspecified fracture of right patella, sequela: Secondary | ICD-10-CM | POA: Diagnosis not present

## 2016-03-28 DIAGNOSIS — M25561 Pain in right knee: Secondary | ICD-10-CM | POA: Diagnosis not present

## 2016-03-28 DIAGNOSIS — S8991XD Unspecified injury of right lower leg, subsequent encounter: Secondary | ICD-10-CM | POA: Diagnosis not present

## 2016-03-28 DIAGNOSIS — R29898 Other symptoms and signs involving the musculoskeletal system: Secondary | ICD-10-CM | POA: Diagnosis not present

## 2016-03-28 DIAGNOSIS — R262 Difficulty in walking, not elsewhere classified: Secondary | ICD-10-CM | POA: Diagnosis not present

## 2016-04-05 DIAGNOSIS — R29898 Other symptoms and signs involving the musculoskeletal system: Secondary | ICD-10-CM | POA: Diagnosis not present

## 2016-04-05 DIAGNOSIS — M25661 Stiffness of right knee, not elsewhere classified: Secondary | ICD-10-CM | POA: Diagnosis not present

## 2016-04-05 DIAGNOSIS — R262 Difficulty in walking, not elsewhere classified: Secondary | ICD-10-CM | POA: Diagnosis not present

## 2016-04-05 DIAGNOSIS — S8991XD Unspecified injury of right lower leg, subsequent encounter: Secondary | ICD-10-CM | POA: Diagnosis not present

## 2016-04-05 DIAGNOSIS — S82001S Unspecified fracture of right patella, sequela: Secondary | ICD-10-CM | POA: Diagnosis not present

## 2016-05-23 DIAGNOSIS — S62643A Nondisplaced fracture of proximal phalanx of left middle finger, initial encounter for closed fracture: Secondary | ICD-10-CM | POA: Diagnosis not present

## 2016-05-23 DIAGNOSIS — E109 Type 1 diabetes mellitus without complications: Secondary | ICD-10-CM | POA: Diagnosis not present

## 2016-05-23 DIAGNOSIS — Z136 Encounter for screening for cardiovascular disorders: Secondary | ICD-10-CM | POA: Diagnosis not present

## 2016-05-23 DIAGNOSIS — Z0001 Encounter for general adult medical examination with abnormal findings: Secondary | ICD-10-CM | POA: Diagnosis not present

## 2016-05-23 DIAGNOSIS — R030 Elevated blood-pressure reading, without diagnosis of hypertension: Secondary | ICD-10-CM | POA: Diagnosis not present

## 2016-05-23 DIAGNOSIS — Z8781 Personal history of (healed) traumatic fracture: Secondary | ICD-10-CM | POA: Diagnosis not present

## 2016-05-23 DIAGNOSIS — Z13220 Encounter for screening for lipoid disorders: Secondary | ICD-10-CM | POA: Diagnosis not present

## 2016-05-24 ENCOUNTER — Telehealth: Payer: Self-pay | Admitting: Endocrinology

## 2016-05-24 NOTE — Telephone Encounter (Signed)
I contacted the patient and advised of message. Patient voiced understanding and had no further questions at this time.  

## 2016-05-24 NOTE — Telephone Encounter (Signed)
please call patient: I got your cbg records--thanks. Please continue the same pump settings. I'll see you next time.

## 2016-05-26 DIAGNOSIS — S62613A Displaced fracture of proximal phalanx of left middle finger, initial encounter for closed fracture: Secondary | ICD-10-CM | POA: Diagnosis not present

## 2016-06-27 ENCOUNTER — Encounter: Payer: Self-pay | Admitting: Endocrinology

## 2016-06-27 ENCOUNTER — Ambulatory Visit (INDEPENDENT_AMBULATORY_CARE_PROVIDER_SITE_OTHER): Payer: Medicare Other | Admitting: Endocrinology

## 2016-06-27 VITALS — BP 122/64 | HR 74 | Ht 61.0 in | Wt 101.0 lb

## 2016-06-27 DIAGNOSIS — E10649 Type 1 diabetes mellitus with hypoglycemia without coma: Secondary | ICD-10-CM

## 2016-06-27 LAB — POCT GLYCOSYLATED HEMOGLOBIN (HGB A1C): Hemoglobin A1C: 7.1

## 2016-06-27 NOTE — Progress Notes (Signed)
Subjective:    Patient ID: Kendra Harper, female    DOB: 04-24-1941, 76 y.o.   MRN: [id number]  HPI Pt returns for f/u of diabetes mellitus: DM type: 1 Dx'ed: 99991111 Complications: none Therapy: insulin since dx GDM: G0 DKA: never Severe hypoglycemia: multiple episodes (most recently in 2017) Pancreatitis: never Other: she has been on pump rx (medtronic paradigm) since 2002; she has hypoglycemia unawareness. Interval history: She uses these pump settings: basal rate of 0.2 units/hr, except for 0.6 units/hr, 3 am to 6 am; she suspends for 1-2 hrs, for activity mealtime bolus of 1 unit/12 grams carbohydrate midnight-10 AM, and 1 unit/14 grams at other times.   correction bolus (which some people call "sensitivity," or "insulin sensitivity ratio," or just "isr") of 1 unit for each 50 by which your glucose exceeds 100.   she brings a record of her cbg's which i have reviewed today.  It varies from 41-227.  There is no trend throughout the day.  She is unable to cite any reasons for the variation in cbg's, except it is lower if she has not eating in some hours. However, she does not have any fasting hypoglycemia.  She says hypoglycemia is less frequent now (1-2 times per week), since she suspends the pump for activity. She takes approx total units per day, via her pump.  Past Medical History:  Diagnosis Date  . Diabetes ([place])   . Diabetes mellitus without complication [facility])     Past Surgical History:  Procedure Laterality Date  . ABDOMINAL HYSTERECTOMY    . APPENDECTOMY    . EYE SURGERY    . heel surgery Bilateral   . TONSILLECTOMY      Social History   Social History  . Marital status: Widowed    Spouse name: N/A  . Number of children: N/A  . Years of education: N/A   Occupational History  . Not on file.   Social History Main Topics  . Smoking status: Never Smoker  . Smokeless tobacco: Never Used  . Alcohol use No  . Drug use: No  . Sexual activity: Not on file    Other Topics Concern  . Not on file   Social History Narrative  . No narrative on file    Current Outpatient Prescriptions on File Prior to Visit  Medication Sig Dispense Refill  . glucose blood (ONE TOUCH ULTRA TEST) test strip Use to check blood sugar 7 times per day. (Patient taking differently: Use to check blood sugar 8 times per day.) 250 each 5  . Insulin Human (INSULIN PUMP) SOLN 10/1 carb ratiohumalog insulin 10 each 2  . insulin lispro (HUMALOG) 100 UNIT/ML injection Use as directed.     No current facility-administered medications on file prior to visit.     Allergies  Allergen Reactions  . Lyrica [Pregabalin] Other (See Comments)    suicidal     Family History  Problem Relation Age of Onset  . Diabetes Father   . Diabetes Brother     type 1    BP 122/64   Pulse 74   Ht 5\' 1"  (1.549 m)   Wt 101 lb (45.8 kg)   SpO2 97%   BMI 19.08 kg/m    Review of Systems Denies LOC.      Objective:   Physical Exam VITAL SIGNS:  See vs page GENERAL: no distress Pulses: dorsalis pedis intact bilat.   MSK: no deformity of the feet CV: no leg edema Skin:  no  ulcer on the feet.  normal color and temp on the feet. Neuro: sensation is intact to touch on the feet  A1c=7.1%    Assessment & Plan:  Type 1 DM: still slightly overcontrolled.  Patient is advised the following: Patient Instructions  check your blood sugar 8 times a day: before the 3 meals, and at bedtime.  also check if you have symptoms of your blood sugar being too high or too low.  please keep a record of the readings and bring it to your next appointment here (or you can bring the meter itself).  You can write it on any piece of paper.  please call us sooner if your blood sugar goes below 70, or if you have a lot of readings over 200.   Please take these pump settings:  basal rate of 0.1 units/hr, except for 0.6 units/hr, 3 am to 6 am, and suspend for 1-2 hrs, for activity.   mealtime bolus of 1  unit/12 grams carbohydrate midnight-10 AM, and 1 unit/14 grams at other times.   correction bolus (which some people call "sensitivity," or "insulin sensitivity ratio," or just "isr") of 1 unit for each 57 by which your glucose exceeds 100.   Please come back for a follow-up appointment in 3 months.

## 2016-06-27 NOTE — Patient Instructions (Addendum)
check your blood sugar 8 times a day: before the 3 meals, and at bedtime.  also check if you have symptoms of your blood sugar being too high or too low.  please keep a record of the readings and bring it to your next appointment here (or you can bring the meter itself).  You can write it on any piece of paper.  please call us sooner if your blood sugar goes below 70, or if you have a lot of readings over 200.   Please take these pump settings:  basal rate of 0.1 units/hr, except for 0.6 units/hr, 3 am to 6 am, and suspend for 1-2 hrs, for activity.   mealtime bolus of 1 unit/12 grams carbohydrate midnight-10 AM, and 1 unit/14 grams at other times.   correction bolus (which some people call "sensitivity," or "insulin sensitivity ratio," or just "isr") of 1 unit for each 57 by which your glucose exceeds 100.   Please come back for a follow-up appointment in 3 months.

## 2016-07-07 ENCOUNTER — Other Ambulatory Visit: Payer: Self-pay | Admitting: Endocrinology

## 2016-07-24 DIAGNOSIS — H25813 Combined forms of age-related cataract, bilateral: Secondary | ICD-10-CM | POA: Diagnosis not present

## 2016-07-24 DIAGNOSIS — H40053 Ocular hypertension, bilateral: Secondary | ICD-10-CM | POA: Diagnosis not present

## 2016-07-24 DIAGNOSIS — E119 Type 2 diabetes mellitus without complications: Secondary | ICD-10-CM | POA: Diagnosis not present

## 2016-09-22 DIAGNOSIS — Z1231 Encounter for screening mammogram for malignant neoplasm of breast: Secondary | ICD-10-CM | POA: Diagnosis not present

## 2016-09-22 DIAGNOSIS — E109 Type 1 diabetes mellitus without complications: Secondary | ICD-10-CM | POA: Diagnosis not present

## 2016-09-22 DIAGNOSIS — Z23 Encounter for immunization: Secondary | ICD-10-CM | POA: Diagnosis not present

## 2016-09-22 DIAGNOSIS — Z0000 Encounter for general adult medical examination without abnormal findings: Secondary | ICD-10-CM | POA: Diagnosis not present

## 2016-09-22 DIAGNOSIS — R072 Precordial pain: Secondary | ICD-10-CM | POA: Diagnosis not present

## 2016-09-25 ENCOUNTER — Ambulatory Visit: Payer: Medicare Other | Admitting: Endocrinology

## 2016-09-26 DIAGNOSIS — E109 Type 1 diabetes mellitus without complications: Secondary | ICD-10-CM | POA: Diagnosis not present

## 2016-09-26 DIAGNOSIS — R072 Precordial pain: Secondary | ICD-10-CM | POA: Diagnosis not present

## 2016-09-26 DIAGNOSIS — Z1231 Encounter for screening mammogram for malignant neoplasm of breast: Secondary | ICD-10-CM | POA: Diagnosis not present

## 2016-09-26 DIAGNOSIS — R809 Proteinuria, unspecified: Secondary | ICD-10-CM | POA: Insufficient documentation

## 2016-09-27 DIAGNOSIS — Z1231 Encounter for screening mammogram for malignant neoplasm of breast: Secondary | ICD-10-CM | POA: Diagnosis not present

## 2016-10-12 DIAGNOSIS — E109 Type 1 diabetes mellitus without complications: Secondary | ICD-10-CM | POA: Diagnosis not present

## 2016-10-30 DIAGNOSIS — R072 Precordial pain: Secondary | ICD-10-CM | POA: Diagnosis not present

## 2016-11-29 DIAGNOSIS — H25813 Combined forms of age-related cataract, bilateral: Secondary | ICD-10-CM | POA: Diagnosis not present

## 2016-11-29 DIAGNOSIS — H40053 Ocular hypertension, bilateral: Secondary | ICD-10-CM | POA: Diagnosis not present

## 2016-11-29 DIAGNOSIS — H1859 Other hereditary corneal dystrophies: Secondary | ICD-10-CM | POA: Diagnosis not present

## 2016-12-03 DIAGNOSIS — R739 Hyperglycemia, unspecified: Secondary | ICD-10-CM | POA: Diagnosis not present

## 2016-12-03 DIAGNOSIS — E161 Other hypoglycemia: Secondary | ICD-10-CM | POA: Diagnosis not present

## 2017-01-10 DIAGNOSIS — H40053 Ocular hypertension, bilateral: Secondary | ICD-10-CM | POA: Diagnosis not present

## 2017-01-10 DIAGNOSIS — H1859 Other hereditary corneal dystrophies: Secondary | ICD-10-CM | POA: Diagnosis not present

## 2017-01-10 DIAGNOSIS — H25813 Combined forms of age-related cataract, bilateral: Secondary | ICD-10-CM | POA: Diagnosis not present

## 2017-01-11 DIAGNOSIS — E109 Type 1 diabetes mellitus without complications: Secondary | ICD-10-CM | POA: Diagnosis not present

## 2017-04-19 DIAGNOSIS — E109 Type 1 diabetes mellitus without complications: Secondary | ICD-10-CM | POA: Diagnosis not present

## 2017-04-19 DIAGNOSIS — M79672 Pain in left foot: Secondary | ICD-10-CM | POA: Diagnosis not present

## 2017-05-08 DIAGNOSIS — E109 Type 1 diabetes mellitus without complications: Secondary | ICD-10-CM | POA: Diagnosis not present

## 2017-05-08 DIAGNOSIS — M85871 Other specified disorders of bone density and structure, right ankle and foot: Secondary | ICD-10-CM | POA: Diagnosis not present

## 2017-05-08 DIAGNOSIS — M85872 Other specified disorders of bone density and structure, left ankle and foot: Secondary | ICD-10-CM | POA: Diagnosis not present

## 2017-05-08 DIAGNOSIS — M7742 Metatarsalgia, left foot: Secondary | ICD-10-CM | POA: Diagnosis not present

## 2017-05-08 DIAGNOSIS — L851 Acquired keratosis [keratoderma] palmaris et plantaris: Secondary | ICD-10-CM | POA: Diagnosis not present

## 2017-05-23 DIAGNOSIS — H04123 Dry eye syndrome of bilateral lacrimal glands: Secondary | ICD-10-CM | POA: Diagnosis not present

## 2017-05-23 DIAGNOSIS — H25813 Combined forms of age-related cataract, bilateral: Secondary | ICD-10-CM | POA: Diagnosis not present

## 2017-05-23 DIAGNOSIS — H01021 Squamous blepharitis right upper eyelid: Secondary | ICD-10-CM | POA: Diagnosis not present

## 2017-05-23 DIAGNOSIS — H01022 Squamous blepharitis right lower eyelid: Secondary | ICD-10-CM | POA: Diagnosis not present

## 2017-05-23 DIAGNOSIS — H01025 Squamous blepharitis left lower eyelid: Secondary | ICD-10-CM | POA: Diagnosis not present

## 2017-05-23 DIAGNOSIS — H40053 Ocular hypertension, bilateral: Secondary | ICD-10-CM | POA: Diagnosis not present

## 2017-05-23 DIAGNOSIS — H01024 Squamous blepharitis left upper eyelid: Secondary | ICD-10-CM | POA: Diagnosis not present

## 2017-06-12 DIAGNOSIS — H01024 Squamous blepharitis left upper eyelid: Secondary | ICD-10-CM | POA: Diagnosis not present

## 2017-06-12 DIAGNOSIS — E119 Type 2 diabetes mellitus without complications: Secondary | ICD-10-CM | POA: Diagnosis not present

## 2017-06-12 DIAGNOSIS — H04123 Dry eye syndrome of bilateral lacrimal glands: Secondary | ICD-10-CM | POA: Diagnosis not present

## 2017-06-12 DIAGNOSIS — H25813 Combined forms of age-related cataract, bilateral: Secondary | ICD-10-CM | POA: Diagnosis not present

## 2017-06-12 DIAGNOSIS — H01022 Squamous blepharitis right lower eyelid: Secondary | ICD-10-CM | POA: Diagnosis not present

## 2017-06-12 DIAGNOSIS — H01025 Squamous blepharitis left lower eyelid: Secondary | ICD-10-CM | POA: Diagnosis not present

## 2017-06-12 DIAGNOSIS — D3131 Benign neoplasm of right choroid: Secondary | ICD-10-CM | POA: Diagnosis not present

## 2017-06-12 DIAGNOSIS — H40053 Ocular hypertension, bilateral: Secondary | ICD-10-CM | POA: Diagnosis not present

## 2017-06-12 DIAGNOSIS — H01021 Squamous blepharitis right upper eyelid: Secondary | ICD-10-CM | POA: Diagnosis not present

## 2017-06-21 DIAGNOSIS — H2512 Age-related nuclear cataract, left eye: Secondary | ICD-10-CM | POA: Diagnosis not present

## 2017-07-03 DIAGNOSIS — H2511 Age-related nuclear cataract, right eye: Secondary | ICD-10-CM | POA: Diagnosis not present

## 2017-07-03 DIAGNOSIS — Z961 Presence of intraocular lens: Secondary | ICD-10-CM | POA: Diagnosis not present

## 2017-07-05 DIAGNOSIS — H2511 Age-related nuclear cataract, right eye: Secondary | ICD-10-CM | POA: Diagnosis not present

## 2017-07-25 DIAGNOSIS — E109 Type 1 diabetes mellitus without complications: Secondary | ICD-10-CM | POA: Diagnosis not present

## 2017-09-24 DIAGNOSIS — Z0000 Encounter for general adult medical examination without abnormal findings: Secondary | ICD-10-CM | POA: Diagnosis not present

## 2017-09-24 DIAGNOSIS — M810 Age-related osteoporosis without current pathological fracture: Secondary | ICD-10-CM | POA: Diagnosis not present

## 2017-09-24 DIAGNOSIS — Z23 Encounter for immunization: Secondary | ICD-10-CM | POA: Diagnosis not present

## 2017-09-25 ENCOUNTER — Other Ambulatory Visit: Payer: Self-pay | Admitting: Family Medicine

## 2017-09-25 DIAGNOSIS — E2839 Other primary ovarian failure: Secondary | ICD-10-CM

## 2017-09-28 ENCOUNTER — Ambulatory Visit
Admission: RE | Admit: 2017-09-28 | Discharge: 2017-09-28 | Disposition: A | Discharge status: Home / Self Care | Payer: Medicare Other | Source: Ambulatory Visit | Attending: Family Medicine | Admitting: Family Medicine

## 2017-09-28 DIAGNOSIS — M8588 Other specified disorders of bone density and structure, other site: Secondary | ICD-10-CM | POA: Diagnosis not present

## 2017-09-28 DIAGNOSIS — M810 Age-related osteoporosis without current pathological fracture: Secondary | ICD-10-CM | POA: Diagnosis not present

## 2017-09-28 DIAGNOSIS — E2839 Other primary ovarian failure: Secondary | ICD-10-CM

## 2017-09-28 DIAGNOSIS — Z780 Asymptomatic menopausal state: Secondary | ICD-10-CM | POA: Diagnosis not present

## 2017-10-24 DIAGNOSIS — E1065 Type 1 diabetes mellitus with hyperglycemia: Secondary | ICD-10-CM | POA: Diagnosis not present

## 2017-11-21 DIAGNOSIS — Z961 Presence of intraocular lens: Secondary | ICD-10-CM | POA: Diagnosis not present

## 2017-11-21 DIAGNOSIS — H04123 Dry eye syndrome of bilateral lacrimal glands: Secondary | ICD-10-CM | POA: Diagnosis not present

## 2017-11-21 DIAGNOSIS — H01022 Squamous blepharitis right lower eyelid: Secondary | ICD-10-CM | POA: Diagnosis not present

## 2017-11-21 DIAGNOSIS — H01021 Squamous blepharitis right upper eyelid: Secondary | ICD-10-CM | POA: Diagnosis not present

## 2017-11-21 DIAGNOSIS — H40013 Open angle with borderline findings, low risk, bilateral: Secondary | ICD-10-CM | POA: Diagnosis not present

## 2017-11-21 DIAGNOSIS — H01025 Squamous blepharitis left lower eyelid: Secondary | ICD-10-CM | POA: Diagnosis not present

## 2017-11-21 DIAGNOSIS — H01024 Squamous blepharitis left upper eyelid: Secondary | ICD-10-CM | POA: Diagnosis not present

## 2018-01-24 DIAGNOSIS — E1065 Type 1 diabetes mellitus with hyperglycemia: Secondary | ICD-10-CM | POA: Diagnosis not present

## 2018-03-28 DIAGNOSIS — E119 Type 2 diabetes mellitus without complications: Secondary | ICD-10-CM | POA: Diagnosis not present

## 2018-03-28 DIAGNOSIS — H409 Unspecified glaucoma: Secondary | ICD-10-CM | POA: Diagnosis not present

## 2018-03-28 DIAGNOSIS — M810 Age-related osteoporosis without current pathological fracture: Secondary | ICD-10-CM | POA: Diagnosis not present

## 2018-04-25 DIAGNOSIS — E1065 Type 1 diabetes mellitus with hyperglycemia: Secondary | ICD-10-CM | POA: Diagnosis not present

## 2018-06-03 ENCOUNTER — Encounter: Payer: Self-pay | Admitting: Family Medicine

## 2018-06-03 LAB — HM DIABETES EYE EXAM

## 2020-04-29 DIAGNOSIS — F419 Anxiety disorder, unspecified: Secondary | ICD-10-CM | POA: Insufficient documentation

## 2020-09-28 ENCOUNTER — Telehealth: Payer: Self-pay

## 2020-09-28 NOTE — Telephone Encounter (Signed)
ERROR

## 2020-10-22 ENCOUNTER — Encounter: Payer: Self-pay | Admitting: Cardiology

## 2020-10-22 ENCOUNTER — Other Ambulatory Visit: Payer: Self-pay

## 2020-10-22 ENCOUNTER — Ambulatory Visit (INDEPENDENT_AMBULATORY_CARE_PROVIDER_SITE_OTHER): Payer: Medicare Other

## 2020-10-22 ENCOUNTER — Ambulatory Visit (INDEPENDENT_AMBULATORY_CARE_PROVIDER_SITE_OTHER): Payer: Medicare Other | Admitting: Cardiology

## 2020-10-22 VITALS — BP 120/70 | HR 86 | Ht 61.0 in | Wt 97.0 lb

## 2020-10-22 DIAGNOSIS — R079 Chest pain, unspecified: Secondary | ICD-10-CM

## 2020-10-22 DIAGNOSIS — I491 Atrial premature depolarization: Secondary | ICD-10-CM

## 2020-10-22 DIAGNOSIS — R9431 Abnormal electrocardiogram [ECG] [EKG]: Secondary | ICD-10-CM

## 2020-10-22 DIAGNOSIS — R072 Precordial pain: Secondary | ICD-10-CM

## 2020-10-22 NOTE — Addendum Note (Signed)
Addended by: Antonieta Iba on: 10/22/2020 10:10 AM   Modules accepted: Orders

## 2020-10-22 NOTE — Progress Notes (Signed)
Cardiology CONSULT Note    Date:  10/22/2020   ID:  Kendra Harper, DOB 1941-05-01, MRN [id number]  PCP:  Kendra Moro, DO  Cardiologist:  Kendra Him, MD   Chief Complaint  Patient presents with  . New Patient (Initial Visit)    Abnormal EKG    History of Present Illness:  Kendra Harper is a 80 y.o. female who is being seen today for the evaluation of abnormal EKG at the request of Kendra Moro, DO.  This is a 80yo female with a hx of Type 1 DM and glaucoma who was seen by her PCP and noted to have skipping of her heart beat and was noted to have an abnormal EKG.  She tells me that she has been getting tightness in her chest whenever she exerts herself and has been going on for year.  She also will get it at rest some as well.  It is very sporadic.  It is across her entire chest but no radiation into her arms or neck but does get diaphoretic and SOB with the pain and will have to stop her walking. She walks 5 miles after every meal and has had to cut back on it because she had been getting fatigued when she exercises now.  She denies any DOE, PND, orthopnea, LE edema, dizziness or syncope. She has not had any palpitations.  She also admits to having leg pains at night or if sitting for too long.  She has not problems with leg pain when walking.  She has never smoked.  She has a fm hx of MIs in 67's in her paternal GM and GF.  Past Medical History:  Diagnosis Date  . Allergic rhinitis   . Diabetes mellitus without complication ([place])   . Glaucoma   . Osteoporosis   . Protein calorie malnutrition ([place])     Past Surgical History:  Procedure Laterality Date  . ABDOMINAL HYSTERECTOMY    . APPENDECTOMY    . EYE SURGERY    . heel surgery Bilateral   . PARATHYROIDECTOMY    . TONSILLECTOMY      Current Medications: Current Meds  Medication Sig  . insulin aspart (NOVOLOG) 100 UNIT/ML injection Inject into the skin.  Marland Kitchen latanoprost (XALATAN) 0.005 % ophthalmic solution 1 drop at  bedtime.  . ONE TOUCH ULTRA TEST test strip TEST BLOOD SUGAR 7 TIMES DAILY  . [DISCONTINUED] Insulin Human (INSULIN PUMP) SOLN 10/1 carb ratiohumalog insulin    Allergies:   Lyrica [pregabalin]   Social History   Socioeconomic History  . Marital status: Widowed    Spouse name: Not on file  . Number of children: Not on file  . Years of education: Not on file  . Highest education level: Not on file  Occupational History  . Not on file  Tobacco Use  . Smoking status: Never Smoker  . Smokeless tobacco: Never Used  Substance and Sexual Activity  . Alcohol use: No  . Drug use: No  . Sexual activity: Not on file  Other Topics Concern  . Not on file  Social History Narrative  . Not on file   Social Determinants of Health   Financial Resource Strain: Not on file  Food Insecurity: Not on file  Transportation Needs: Not on file  Physical Activity: Not on file  Stress: Not on file  Social Connections: Not on file     Family History:  The patient's family history includes CAD in her paternal grandfather and paternal  grandmother; Cancer in her brother; Diabetes in her brother and father; Heart failure in her father.   ROS:   Please see the history of present illness.    ROS All other systems reviewed and are negative.  No flowsheet data found.     PHYSICAL EXAM:   VS:  BP 120/70   Pulse 86   Ht 5\' 1"  (1.549 m)   Wt 97 lb (44 kg)   SpO2 96%   BMI 18.33 kg/m    GEN: Well nourished, well developed, in no acute distress  HEENT: normal  Neck: no JVD, carotid bruits, or masses Cardiac: RRR; no murmurs, rubs, or gallops,no edema.  Intact distal pulses bilaterally.Occasional ectopy Respiratory:  clear to auscultation bilaterally, normal work of breathing GI: soft, nontender, nondistended, + BS MS: no deformity or atrophy  Skin: warm and dry, no rash Neuro:  Alert and Oriented x 3, Strength and sensation are intact Psych: euthymic mood, full affect  Wt Readings from Last  3 Encounters:  10/22/20 97 lb (44 kg)  06/27/16 101 lb (45.8 kg)  03/27/16 101 lb (45.8 kg)      Studies/Labs Reviewed:   EKG:  EKG is ordered today.  The ekg ordered today demonstrates NSR with PACs and bigeminal PACs  Recent Labs: No results found for requested labs within last 8760 hours.   Lipid Panel No results found for: CHOL, TRIG, HDL, CHOLHDL, VLDL, LDLCALC, LDLDIRECT   Additional studies/ records that were reviewed today include:  OV notes and labs from PCP    ASSESSMENT:    1. Nonspecific abnormal electrocardiogram (ECG) (EKG)   2. Chest pain of uncertain etiology      PLAN:  In order of problems listed above:  1. Abnormal EKG/PACs -she is completely asymptomatic -I will get a 2 week Ziopatch to make sure there is no silent PAF  2.  Chest pain -her chest pain is very concerning.  It occurs with exertion and is associated with SOB and diaphoresis and resolves with rest.  She has had to cut back on walking due to exertional Fatigue  She has a fm hx of premature CAD in her PGM and PGF.  She also has IDDM -I will get a stress myoview to rule out ischemia -check 2D echo to assess LVF -Shared Decision Making/Informed Consent The risks [chest pain, shortness of breath, cardiac arrhythmias, dizziness, blood pressure fluctuations, myocardial infarction, stroke/transient ischemic attack, nausea, vomiting, allergic reaction, radiation exposure, metallic taste sensation and life-threatening complications (estimated to be 1 in 10,000)], benefits (risk stratification, diagnosing coronary artery disease, treatment guidance) and alternatives of a nuclear stress test were discussed in detail with Ms. Girouard and she agrees to proceed.  Time Spent:  25 minutes total time of encounter, including 15 minutes spent in face-to-face patient care on the date of this encounter. This time includes coordination of care and counseling regarding above mentioned problem list. Remainder of  non-face-to-face time involved reviewing chart documents/testing relevant to the patient encounter and documentation in the medical record. I have independently reviewed documentation from referring provider  Medication Adjustments/Labs and Tests Ordered: Current medicines are reviewed at length with the patient today.  Concerns regarding medicines are outlined above.  Medication changes, Labs and Tests ordered today are listed in the Patient Instructions below.  There are no Patient Instructions on file for this visit.   Signed, Kendra Him, MD  10/22/2020 9:39 AM    [facility] [place],  [place]  [address] Phone: [phone]; Fax: [phone]

## 2020-10-22 NOTE — Addendum Note (Signed)
Addended by: Antonieta Iba on: 10/22/2020 09:45 AM   Modules accepted: Orders

## 2020-10-22 NOTE — Addendum Note (Signed)
Addended by: Fransico Him R on: 10/22/2020 11:36 AM   Modules accepted: Orders

## 2020-10-22 NOTE — Patient Instructions (Signed)
Medication Instructions:  Your physician recommends that you continue on your current medications as directed. Please refer to the Current Medication list given to you today.  *If you need a refill on your cardiac medications before your next appointment, please call your pharmacy*   Testing/Procedures: Your physician has requested that you have a stress myoview.  Your physician has requested that you have an echocardiogram. Echocardiography is a painless test that uses sound waves to create images of your heart. It provides your doctor with information about the size and shape of your heart and how well your heart's chambers and valves are working. This procedure takes approximately one hour. There are no restrictions for this procedure.  Your physician has recommended that you wear an event monitor. Event monitors are medical devices that record the heart's electrical activity. Doctors most often [place] these monitors to diagnose arrhythmias. Arrhythmias are problems with the speed or rhythm of the heartbeat. The monitor is a small, portable device. You can wear one while you do your normal daily activities. This is usually used to diagnose what is causing palpitations/syncope (passing out).   Follow-Up: At [facility], you and your health needs are our priority.  As part of our continuing mission to provide you with exceptional heart care, we have created designated Provider Care Teams.  These Care Teams include your primary Cardiologist (physician) and Advanced Practice Providers (APPs -  Physician Assistants and Nurse Practitioners) who all work together to provide you with the care you need, when you need it.  Follow up with Dr. Radford Pax as needed based on results of testing   Other Instructions:  ZIO XT- Long Term Monitor Instructions:  Your physician has requested you wear a ZIO patch monitor for 14 days.  This is a single patch monitor. [employer] supplies one patch monitor per enrollment.  Additional stickers are not available. Please do not apply patch if you will be having a Nuclear Stress Test,  Echocardiogram, Cardiac CT, MRI, or Chest Xray during the period you would be wearing the  monitor. The patch cannot be worn during these tests. You cannot remove and re-apply the  ZIO XT patch monitor.  Your ZIO patch monitor will be mailed 3 day USPS to your address on file. It may take 3-5 days  to receive your monitor after you have been enrolled.  Once you have received your monitor, please review the enclosed instructions. Your monitor  has already been registered assigning a specific monitor serial # to you.  Billing and Patient Assistance Program Information  We have supplied [employer] with any of your insurance information on file for billing purposes. [employer] offers a sliding scale Patient Assistance Program for patients that do not have  insurance, or whose insurance does not completely cover the cost of the ZIO monitor.  You must apply for the Patient Assistance Program to qualify for this discounted rate.  To apply, please call [employer] at [phone], select option 4, select option 2, ask to apply for  Patient Assistance Program. Theodore Demark will ask your household income, and how many people  are in your household. They will quote your out-of-pocket cost based on that information.  [employer] will also be able to set up a 63-month, interest-free payment plan if needed.  Applying the monitor   Shave hair from upper left chest.  Hold abrader disc by orange tab. Rub abrader in 40 strokes over the upper left chest as  indicated in your monitor instructions.  Clean area with  4 enclosed alcohol pads. Let dry.  Apply patch as indicated in monitor instructions. Patch will be placed under collarbone on left  side of chest with arrow pointing upward.  Rub patch adhesive wings for 2 minutes. Remove white label marked "1". Remove the white  label marked "2". Rub patch adhesive wings  for 2 additional minutes.  While looking in a mirror, press and release button in center of patch. A small green light will  flash 3-4 times. This will be your only indicator that the monitor has been turned on.  Do not shower for the first 24 hours. You may shower after the first 24 hours.  Press the button if you feel a symptom. You will hear a small click. Record Date, Time and  Symptom in the Patient Logbook.  When you are ready to remove the patch, follow instructions on the last 2 pages of Patient  Logbook. Stick patch monitor onto the last page of Patient Logbook.  Place Patient Logbook in the blue and white box. Use locking tab on box and tape box closed  securely. The blue and white box has prepaid postage on it. Please place it in the mailbox as  soon as possible. Your physician should have your test results approximately 7 days after the  monitor has been mailed back to [facility].  Call [facility] at [phone] if you have questions regarding  your ZIO XT patch monitor. Call them immediately if you see an orange light blinking on your  monitor.  If your monitor falls off in less than 4 days, contact our Monitor department at [phone].  If your monitor becomes loose or falls off after 4 days call [employer] at [phone] for  suggestions on securing your monitor

## 2020-10-22 NOTE — Progress Notes (Unsigned)
Enrolled patient for a 14 day Zio XT Monitor to be mailed to patients home  

## 2020-10-27 DIAGNOSIS — R9431 Abnormal electrocardiogram [ECG] [EKG]: Secondary | ICD-10-CM | POA: Diagnosis not present

## 2020-10-27 DIAGNOSIS — I491 Atrial premature depolarization: Secondary | ICD-10-CM | POA: Diagnosis not present

## 2020-10-27 DIAGNOSIS — R079 Chest pain, unspecified: Secondary | ICD-10-CM | POA: Diagnosis not present

## 2020-10-27 DIAGNOSIS — R072 Precordial pain: Secondary | ICD-10-CM | POA: Diagnosis not present

## 2020-10-27 NOTE — Addendum Note (Signed)
Addended by: Lanna Poche R on: 10/27/2020 12:44 PM   Modules accepted: Orders

## 2020-11-07 ENCOUNTER — Emergency Department (HOSPITAL_COMMUNITY): Payer: Medicare Other

## 2020-11-07 ENCOUNTER — Emergency Department (HOSPITAL_COMMUNITY)
Admission: EM | Admit: 2020-11-07 | Discharge: 2020-11-07 | Disposition: A | Discharge status: Home / Self Care | Payer: Medicare Other | Attending: Emergency Medicine | Admitting: Emergency Medicine

## 2020-11-07 ENCOUNTER — Other Ambulatory Visit: Payer: Self-pay

## 2020-11-07 ENCOUNTER — Encounter (HOSPITAL_COMMUNITY): Payer: Self-pay | Admitting: Emergency Medicine

## 2020-11-07 DIAGNOSIS — S0512XA Contusion of eyeball and orbital tissues, left eye, initial encounter: Secondary | ICD-10-CM | POA: Insufficient documentation

## 2020-11-07 DIAGNOSIS — Z794 Long term (current) use of insulin: Secondary | ICD-10-CM | POA: Diagnosis not present

## 2020-11-07 DIAGNOSIS — R519 Headache, unspecified: Secondary | ICD-10-CM | POA: Diagnosis not present

## 2020-11-07 DIAGNOSIS — Y92480 Sidewalk as the place of occurrence of the external cause: Secondary | ICD-10-CM | POA: Insufficient documentation

## 2020-11-07 DIAGNOSIS — W01198A Fall on same level from slipping, tripping and stumbling with subsequent striking against other object, initial encounter: Secondary | ICD-10-CM | POA: Insufficient documentation

## 2020-11-07 DIAGNOSIS — S80212A Abrasion, left knee, initial encounter: Secondary | ICD-10-CM | POA: Diagnosis not present

## 2020-11-07 DIAGNOSIS — S42332A Displaced oblique fracture of shaft of humerus, left arm, initial encounter for closed fracture: Secondary | ICD-10-CM | POA: Insufficient documentation

## 2020-11-07 DIAGNOSIS — E119 Type 2 diabetes mellitus without complications: Secondary | ICD-10-CM | POA: Diagnosis not present

## 2020-11-07 DIAGNOSIS — S4992XA Unspecified injury of left shoulder and upper arm, initial encounter: Secondary | ICD-10-CM | POA: Diagnosis present

## 2020-11-07 DIAGNOSIS — W19XXXA Unspecified fall, initial encounter: Secondary | ICD-10-CM

## 2020-11-07 DIAGNOSIS — Y9301 Activity, walking, marching and hiking: Secondary | ICD-10-CM | POA: Insufficient documentation

## 2020-11-07 MED ORDER — OXYCODONE HCL 5 MG PO TABS: 5.0000 mg | ORAL_TABLET | Freq: Four times a day (QID) | ORAL | 0 refills | Status: DC | PRN

## 2020-11-07 MED ORDER — FENTANYL CITRATE (PF) 100 MCG/2ML IJ SOLN
50.0000 ug | Freq: Once | INTRAMUSCULAR | Status: AC
  Administered 2020-11-07: 50 ug via INTRAVENOUS
  Filled 2020-11-07: qty 2

## 2020-11-07 MED ORDER — ONDANSETRON HCL 4 MG/2ML IJ SOLN
4.0000 mg | Freq: Once | INTRAMUSCULAR | Status: AC
  Administered 2020-11-07: 4 mg via INTRAVENOUS
  Filled 2020-11-07: qty 2

## 2020-11-07 NOTE — Progress Notes (Signed)
Orthopedic Tech Progress Note Patient Details:  Kendra Harper 1941-03-17 [id number]  Ortho Devices Type of Ortho Device: Coapt Ortho Device/Splint Location: lue Ortho Device/Splint Interventions: Ordered, Application, Adjustment   Post Interventions Patient Tolerated: Well Instructions Provided: Care of device, Adjustment of device  [name] 11/07/2020, 8:00 PM

## 2020-11-07 NOTE — ED Provider Notes (Signed)
[facility]ERG[facility] DEPARTMENT Provider Note   CSN: [id number] Arrival date & time: 11/07/20  1548     History Chief Complaint  Patient presents with  . Fall    Kendra Harper is a 80 y.o. female.  Kendra Harper presents after tripping on the sidewalk while walking.  She fell forward and struck her left face and her left arm.  The history is provided by the patient.  Fall This is a new problem. The current episode started less than 1 hour ago. The problem occurs constantly. The problem has been resolved. Pertinent negatives include no chest pain, no abdominal pain, no headaches and no shortness of breath. Exacerbated by: movement of the left arm. Relieved by: fentanyl, splint. Treatments tried: splint, IV medication. The treatment provided mild relief.      Past Medical History:  Diagnosis Date  . Allergic rhinitis   . Diabetes mellitus without complication ([place])   . Glaucoma   . Osteoporosis   . Protein calorie malnutrition [facility])     Patient Active Problem List   Diagnosis Date Noted  . Diabetes ([place]) 01/25/2016    Past Surgical History:  Procedure Laterality Date  . ABDOMINAL HYSTERECTOMY    . APPENDECTOMY    . EYE SURGERY    . heel surgery Bilateral   . PARATHYROIDECTOMY    . TONSILLECTOMY       OB History   No obstetric history on file.     Family History  Problem Relation Age of Onset  . Diabetes Father   . Heart failure Father   . Diabetes Brother        type 1  . Cancer Brother   . CAD Paternal Grandmother        died of MI in 39's  . CAD Paternal Grandfather        died of MI in 38's    Social History   Tobacco Use  . Smoking status: Never  . Smokeless tobacco: Never  Substance Use Topics  . Alcohol use: No  . Drug use: No    Home Medications Prior to Admission medications   Medication Sig Start Date End Date Taking? Authorizing Provider  insulin aspart (NOVOLOG) 100 UNIT/ML injection Inject into the skin.     [provider]  latanoprost (XALATAN) 0.005 % ophthalmic solution 1 drop at bedtime.    [provider]  Magnesium 250 MG TABS Take 1 tablet by mouth 2 (two) times daily. 08/14/20   [provider]  ONE TOUCH ULTRA TEST test strip TEST BLOOD SUGAR 7 TIMES DAILY 07/08/16   Renato Shin, MD    Allergies    Lyrica [pregabalin]  Review of Systems   Review of Systems  Constitutional:  Negative for chills and fever.  HENT:  Negative for ear pain and sore throat.   Eyes:  Negative for pain and visual disturbance.  Respiratory:  Negative for cough and shortness of breath.   Cardiovascular:  Negative for chest pain and palpitations.  Gastrointestinal:  Negative for abdominal pain and vomiting.  Genitourinary:  Negative for dysuria and hematuria.  Musculoskeletal:  Negative for arthralgias and back pain.  Skin:  Negative for color change and rash.  Neurological:  Negative for seizures, syncope and headaches.  All other systems reviewed and are negative.  Physical Exam Updated Vital Signs BP (!) 168/76 (BP Location: Right Arm)   Pulse 69   Temp 98.3 F (36.8 C) (Oral)   Resp 18  Ht 5\' 1"  (1.549 m)   Wt 44 kg   SpO2 99%   BMI 18.33 kg/m   Physical Exam Vitals and nursing note reviewed.  HENT:     Head: Normocephalic.  Eyes:     General: No scleral icterus.    Extraocular Movements: Extraocular movements intact.     Comments: Bruising at the inferior aspect of the left orbit  Pulmonary:     Effort: Pulmonary effort is normal. No respiratory distress.  Musculoskeletal:     Cervical back: Normal range of motion. No tenderness.     Comments: Deformity at the midpoint of the left humerus.  No skin findings.  Sensation is intact.  Distal pulses are normal.  Skin:    General: Skin is warm and dry.     Comments: Small abrasion left knee  Neurological:     General: No focal deficit present.     Mental Status: She is alert and oriented to person, place, and  time.  Psychiatric:        Mood and Affect: Mood normal.    ED Results / Procedures / Treatments   Labs (all labs ordered are listed, but only abnormal results are displayed) Labs Reviewed - No data to display  EKG None  Radiology CT Head Wo Contrast  Result Date: 11/07/2020 CLINICAL DATA:  Head and facial trauma EXAM: CT HEAD WITHOUT CONTRAST CT MAXILLOFACIAL WITHOUT CONTRAST TECHNIQUE: Multidetector CT imaging of the head and maxillofacial structures were performed using the standard protocol without intravenous contrast. Multiplanar CT image reconstructions of the maxillofacial structures were also generated. COMPARISON:  01/23/2016 FINDINGS: CT HEAD FINDINGS Brain: No evidence of acute infarction, hemorrhage, hydrocephalus, extra-axial collection or mass lesion/mass effect. Vascular: Atherosclerotic calcifications involving the large vessels of the skull base. No unexpected hyperdense vessel. Skull: Normal. Negative for fracture or focal lesion. Other: Negative for scalp hematoma. CT MAXILLOFACIAL FINDINGS Osseous: No acute maxillofacial bone fracture. Bony orbital walls are intact. Mandible intact. Temporomandibular joints are aligned without dislocation. Orbits: Negative. No traumatic or inflammatory finding. Sinuses: 10 mm sinonasal polyp versus mucous retention cyst within the anterior left maxillary sinus. Paranasal sinuses are otherwise clear. Soft tissues: Left infraorbital soft tissue swelling without well-defined hematoma. IMPRESSION: 1. No acute intracranial findings. 2. No acute maxillofacial bone fracture. 3. Left infraorbital soft tissue swelling without well-defined hematoma. Electronically Signed   By: Davina Poke D.O.   On: 11/07/2020 17:13   DG Humerus Left  Result Date: 11/07/2020 CLINICAL DATA:  Fall EXAM: LEFT HUMERUS - 2+ VIEW COMPARISON:  None. FINDINGS: Spiral fracture mid shaft of humerus with moderate angulation and 1/2 shaft width of displacement. Normal  shoulder and elbow alignment. IMPRESSION: Spiral fracture mid shaft of humerus with angulation and displacement. Electronically Signed   By: Franchot Gallo M.D.   On: 11/07/2020 17:28   CT Maxillofacial WO CM  Result Date: 11/07/2020 CLINICAL DATA:  Head and facial trauma EXAM: CT HEAD WITHOUT CONTRAST CT MAXILLOFACIAL WITHOUT CONTRAST TECHNIQUE: Multidetector CT imaging of the head and maxillofacial structures were performed using the standard protocol without intravenous contrast. Multiplanar CT image reconstructions of the maxillofacial structures were also generated. COMPARISON:  01/23/2016 FINDINGS: CT HEAD FINDINGS Brain: No evidence of acute infarction, hemorrhage, hydrocephalus, extra-axial collection or mass lesion/mass effect. Vascular: Atherosclerotic calcifications involving the large vessels of the skull base. No unexpected hyperdense vessel. Skull: Normal. Negative for fracture or focal lesion. Other: Negative for scalp hematoma. CT MAXILLOFACIAL FINDINGS Osseous: No acute maxillofacial bone fracture. Bony  orbital walls are intact. Mandible intact. Temporomandibular joints are aligned without dislocation. Orbits: Negative. No traumatic or inflammatory finding. Sinuses: 10 mm sinonasal polyp versus mucous retention cyst within the anterior left maxillary sinus. Paranasal sinuses are otherwise clear. Soft tissues: Left infraorbital soft tissue swelling without well-defined hematoma. IMPRESSION: 1. No acute intracranial findings. 2. No acute maxillofacial bone fracture. 3. Left infraorbital soft tissue swelling without well-defined hematoma. Electronically Signed   By: Davina Poke D.O.   On: 11/07/2020 17:13    Procedures Procedures   Medications Ordered in ED Medications  fentaNYL (SUBLIMAZE) injection 50 mcg (50 mcg Intravenous Given 11/07/20 1641)  ondansetron (ZOFRAN) injection 4 mg (4 mg Intravenous Given 11/07/20 1641)    ED Course  I have reviewed the triage vital signs and the  nursing notes.  Pertinent labs & imaging results that were available during my care of the patient were reviewed by me and considered in my medical decision making (see chart for details).  Clinical Course as of 11/07/20 1956  Sun Nov 07, 2020  1755 I spoke with Dr. Lucia Gaskins of orthopedics  [AW]    Clinical Course User Index [AW] Arnaldo Natal, MD   MDM Rules/Calculators/A&P                          Kendra Harper presents after a mechanical fall.  She sustained a humeral shaft fracture and was placed in a coaptation splint.  She will be seen as an outpatient by orthopedics.  Fortunately, she is right-handed.  I discussed the use of opioid pain medication with her.  We talked about the risk and benefit of this medication as well as the side effects such as constipation. Final Clinical Impression(s) / ED Diagnoses Final diagnoses:  Closed displaced oblique fracture of shaft of left humerus, initial encounter    Rx / DC Orders ED Discharge Orders     None        Arnaldo Natal, MD 11/07/20 [phone]

## 2020-11-07 NOTE — ED Triage Notes (Signed)
PT BIB EMS due to a fall. Pt states she did not have LOC. Pt hit head and has an obvious right humerus deformity. Pt received 67mcg of fentyl and a bag of D50. Pt has a hx of DM. Pts initial BS was 49 and is now 79 per EMS. Pt is axox4.

## 2020-11-07 NOTE — ED Notes (Signed)
Ortho tech notified.  

## 2020-11-10 ENCOUNTER — Other Ambulatory Visit: Payer: Self-pay

## 2020-11-10 ENCOUNTER — Emergency Department (HOSPITAL_COMMUNITY): Payer: Medicare Other

## 2020-11-10 ENCOUNTER — Emergency Department (HOSPITAL_BASED_OUTPATIENT_CLINIC_OR_DEPARTMENT_OTHER): Payer: Medicare Other

## 2020-11-10 ENCOUNTER — Emergency Department (HOSPITAL_COMMUNITY)
Admission: EM | Admit: 2020-11-10 | Discharge: 2020-11-11 | Disposition: A | Discharge status: Home / Self Care | Payer: Medicare Other | Attending: Emergency Medicine | Admitting: Emergency Medicine

## 2020-11-10 DIAGNOSIS — Z8781 Personal history of (healed) traumatic fracture: Secondary | ICD-10-CM | POA: Insufficient documentation

## 2020-11-10 DIAGNOSIS — Z794 Long term (current) use of insulin: Secondary | ICD-10-CM | POA: Diagnosis not present

## 2020-11-10 DIAGNOSIS — E119 Type 2 diabetes mellitus without complications: Secondary | ICD-10-CM | POA: Diagnosis not present

## 2020-11-10 DIAGNOSIS — M7989 Other specified soft tissue disorders: Secondary | ICD-10-CM

## 2020-11-10 MED ORDER — HYDROCODONE-ACETAMINOPHEN 5-325 MG PO TABS
1.0000 | ORAL_TABLET | Freq: Once | ORAL | Status: AC
  Administered 2020-11-10: 1 via ORAL
  Filled 2020-11-10: qty 1

## 2020-11-10 MED ORDER — HYDROCODONE-ACETAMINOPHEN 5-325 MG PO TABS
1.0000 | ORAL_TABLET | Freq: Once | ORAL | Status: AC
Start: 2020-11-10 — End: 2020-11-10
  Administered 2020-11-10: 1 via ORAL
  Filled 2020-11-10: qty 1

## 2020-11-10 NOTE — ED Provider Notes (Signed)
[facility] EMERGENCY DEPARTMENT Provider Note   CSN: [id number] Arrival date & time: 11/10/20  1240     History Chief Complaint  Patient presents with   Arm Injury     Kendra Harper is a 80 y.o. female.  The history is provided by the patient and medical records.   80 y.o. F with hx of DM, osteoporosis, glaucoma, presenting to the ED for left arm swelling.  States she suffered humerus fracture 11/07/20, seen in the ED and referred to orthopedics.  She saw them 2 days later on 11/09/20 and was told she did not need intervention and just a sling for 2 months and it would heal fine, however states no one took off her splint or actually looked at her arm.   States since that appointment she has had worsening swelling into left hand and today it started to turn blue.  No numbness/tingling of left arm.  She called the orthopedic office she was initially referred to and told to come to the ED.  She is not on anticoagulation and denies any new injury/trauma to the arm.  Past Medical History:  Diagnosis Date   Allergic rhinitis    Diabetes mellitus without complication ([place])    Glaucoma    Osteoporosis    Protein calorie malnutrition [facility])     Patient Active Problem List   Diagnosis Date Noted   Diabetes ([place]) 01/25/2016    Past Surgical History:  Procedure Laterality Date   ABDOMINAL HYSTERECTOMY     APPENDECTOMY     EYE SURGERY     heel surgery Bilateral    PARATHYROIDECTOMY     TONSILLECTOMY       OB History   No obstetric history on file.     Family History  Problem Relation Age of Onset   Diabetes Father    Heart failure Father    Diabetes Brother        type 1   Cancer Brother    CAD Paternal Grandmother        died of MI in 56's   CAD Paternal Grandfather        died of MI in 54's    Social History   Tobacco Use   Smoking status: Never   Smokeless tobacco: Never  Substance Use Topics   Alcohol use: No   Drug use: No    Home  Medications Prior to Admission medications   Medication Sig Start Date End Date Taking? Authorizing Provider  insulin aspart (NOVOLOG) 100 UNIT/ML injection Inject into the skin.    [provider]  latanoprost (XALATAN) 0.005 % ophthalmic solution 1 drop at bedtime.    [provider]  Magnesium 250 MG TABS Take 1 tablet by mouth 2 (two) times daily. 08/14/20   [provider]  ONE TOUCH ULTRA TEST test strip TEST BLOOD SUGAR 7 TIMES DAILY 07/08/16   Renato Shin, MD  oxyCODONE (ROXICODONE) 5 MG immediate release tablet Take 1 tablet (5 mg total) by mouth every 6 (six) hours as needed for up to 20 doses for severe pain. 11/07/20   Arnaldo Natal, MD    Allergies    Lyrica [pregabalin]  Review of Systems   Review of Systems  Musculoskeletal:  Positive for arthralgias.  All other systems reviewed and are negative.  Physical Exam Updated Vital Signs BP 126/66 (BP Location: Right Arm)   Pulse 72   Temp 99.1 F (37.3 C) (Oral)   Resp 16  SpO2 100%   Physical Exam Vitals and nursing note reviewed.  Constitutional:      Appearance: She is well-developed.  HENT:     Head: Normocephalic and atraumatic.  Eyes:     Conjunctiva/sclera: Conjunctivae normal.     Pupils: Pupils are equal, round, and reactive to light.  Cardiovascular:     Rate and Rhythm: Normal rate and regular rhythm.     Heart sounds: Normal heart sounds.  Pulmonary:     Effort: Pulmonary effort is normal.     Breath sounds: Normal breath sounds.  Abdominal:     General: Bowel sounds are normal.     Palpations: Abdomen is soft.  Musculoskeletal:        General: Normal range of motion.     Cervical back: Normal range of motion.     Comments: Gross deformity of left mid-shaft humerus, swelling and bruising noted, radial pulse intact, compartments are soft and compressible, motor function intact distally, moving fingers normally  Skin:    General: Skin is warm and dry.  Neurological:      Mental Status: She is alert and oriented to person, place, and time.       ED Results / Procedures / Treatments   Labs (all labs ordered are listed, but only abnormal results are displayed) Labs Reviewed - No data to display  EKG None  Radiology DG Humerus Left  Result Date: 11/10/2020 CLINICAL DATA:  Fall Sunday with humeral fracture. Left arm swelling. EXAM: LEFT HUMERUS - 2+ VIEW COMPARISON:  Radiograph 11/07/2020 FINDINGS: Angulated midshaft humerus fracture is again seen. There is unchanged lateral angulation, however slight increased displacement of fracture fragments. No intra-articular extension. Shoulder and elbow alignment are maintained. Surrounding soft tissue edema. Overlying splint material in place. IMPRESSION: Angulated midshaft humerus fracture with unchanged lateral angulation, however slight increased displacement of fracture fragments. Electronically Signed   By: Keith Rake M.D.   On: 11/10/2020 18:06   UE VENOUS DUPLEX [facility] 7 am - 7 pm)  Result Date: 11/10/2020 UPPER VENOUS STUDY  Patient Name:  Kendra Harper  Date of Exam:   11/10/2020 Medical Rec #: [id number]       Accession #:    [id number] Date of Birth: Jan 01, 1941       Patient Gender: F Patient Age:   80Y Exam Location:  [facility] Procedure:      VAS [place] UPPER EXTREMITY VENOUS DUPLEX Referring Phys: [id number] [place] --------------------------------------------------------------------------------  Indications: Worse swelling s/p fall and humeral shaft fracture LT Risk Factors: Trauma Spiral fracture mid shaft of humerus with angulation and displacement. Limitations: Cast/sling and bandages, limited mobility. Comparison Study: 11-07-2020 X-ray humerus LT Performing Technologist: Darlin Coco RDMS,RVT  Examination Guidelines: A complete evaluation includes B-mode imaging, spectral Doppler, color Doppler, and power Doppler as needed of all accessible portions of each vessel. Bilateral testing is  considered an integral part of a complete examination. Limited examinations for reoccurring indications may be performed as noted.  Right Findings: +----------+------------+---------+-----------+----------+-------+ RIGHT     CompressiblePhasicitySpontaneousPropertiesSummary +----------+------------+---------+-----------+----------+-------+ Subclavian    Full       Yes       Yes                      +----------+------------+---------+-----------+----------+-------+  Left Findings: +----------+------------+---------+-----------+----------+---------------------+ LEFT      CompressiblePhasicitySpontaneousProperties       Summary        +----------+------------+---------+-----------+----------+---------------------+ IJV  Full       Yes       Yes                                    +----------+------------+---------+-----------+----------+---------------------+ Subclavian    Full       Yes       Yes                                    +----------+------------+---------+-----------+----------+---------------------+ Axillary      Full       Yes       Yes                                    +----------+------------+---------+-----------+----------+---------------------+ Brachial      Full       Yes       Yes                Some segments not                                                          well visualized    +----------+------------+---------+-----------+----------+---------------------+ Radial        Full                                                        +----------+------------+---------+-----------+----------+---------------------+ Ulnar                    Yes       Yes                                    +----------+------------+---------+-----------+----------+---------------------+ Cephalic                                              Some segments not                                                          well  visualized    +----------+------------+---------+-----------+----------+---------------------+ Basilic                                                Not visualized     +----------+------------+---------+-----------+----------+---------------------+  Summary:  Left: No evidence of deep vein thrombosis in the upper extremity. No evidence of superficial vein thrombosis in the upper extremity. However, some portions of this examination were limited. See technologist comments above.  *See  table(s) above for measurements and observations.    Preliminary     Procedures Procedures   Medications Ordered in ED Medications  HYDROcodone-acetaminophen (NORCO/VICODIN) 5-325 MG per tablet 1 tablet (1 tablet Oral Given 11/10/20 1832)    ED Course  I have reviewed the triage vital signs and the nursing notes.  Pertinent labs & imaging results that were available during my care of the patient were reviewed by me and considered in my medical decision making (see chart for details).    MDM Rules/Calculators/A&P  80 y.o. F here with post humerus fracture complications.  Initial injury 11/07/20, has since had orthopedic follow-up but does not sound like this went well in office per her report (no one took down splint, looked at her arm, office party going on, etc) and does not feel comfortable with treatment plan.  States she was told this would not need intervention but due to worsening swelling and discoloration today she was sent to the ED.  On exam, she has gross deformity of left mid-shaft humerus with swelling and bruising.  Radial pulse intact, compartments soft, and easily compressible.  Normal motor function distally and into all fingers.  Repeat x-rays here with worsened displacement of fracture fragments, patient denies any new injury/trauma to the arms.  Also had venous duplex done that was negative for DVT.    Patient has some concerns about returning to the initial orthopedic office she was sent  to and wants a second opinion which is not unreasonable.  10:58 PM Spoke with Dr. Stann Mainland on call for [facility]-- Can call in the morning and try to switch her care to their office, albeit may delay her treatment plan a bit.  Recommends to reapply the coaptation splint.  Also recommends to have her squeeze a sponge/ball throughout the day to keep blood flow from pooling into her hand.  Discussed with patient, she is aware of potential delay will call [facility] in the morning to arrange follow-up.  She will return here for any new/acute changes.  Final Clinical Impression(s) / ED Diagnoses Final diagnoses:  History of humerus fracture    Rx / DC Orders ED Discharge Orders     None        Larene Pickett, PA-C 11/11/20 0201    Lucrezia Starch, MD 11/11/20 [phone]

## 2020-11-10 NOTE — Progress Notes (Signed)
Upper extremity venous LT study completed.  Preliminary results relayed to [place], [place].  See CV Proc for preliminary results report.   Darlin Coco, RDMS, RVT

## 2020-11-10 NOTE — ED Triage Notes (Signed)
Pt back to the ED today with swelling the left hand and elbow , pt broke her humerus on Sunday night , called ortho and was told to come to the ED

## 2020-11-10 NOTE — ED Provider Notes (Signed)
Emergency Medicine Provider Triage Evaluation Note  Kendra Harper , a 80 y.o. female  was evaluated in triage.  Pt complains of new left arm swelling.  Patient was seen 3 days ago in the ER after she sustained mechanical fall leading to humeral shaft fracture.  She saw [facility] 2 days ago.  Plain films showed displacement and angulation. She denies it ever being reduced.  She has pain and considerably more swelling peripherally.  Denies numbness.   Review of Systems  Positive: Distal left arm swelling. Negative: Numbness, cold hand.  New injury.  Physical Exam  BP (!) 150/70 (BP Location: Right Arm)   Pulse (!) 102   Temp 98.2 F (36.8 C) (Oral)   Resp 18   SpO2 96%  Gen:   Awake, no distress   Resp:  Normal effort  MSK:   In splint. Radial pulse intact. Brisk capillary refill.  Sensation intact. Other:    Medical Decision Making  Medically screening exam initiated at 1:43 PM.  Appropriate orders placed.  Kendra Harper was informed that the remainder of the evaluation will be completed by another provider, this initial triage assessment does not replace that evaluation, and the importance of remaining in the ED until their evaluation is complete.   Hilbert Odor, PA-C briefly saw the patient in triage. He said plan is for venous doppler left upper extremity.  Re-cast. Outpatient f/u with her orthopedist ([facility])   Kendra Herter, PA-C 11/10/20 1344    Fredia Sorrow, MD 11/18/20 1349

## 2020-11-11 DIAGNOSIS — M7989 Other specified soft tissue disorders: Secondary | ICD-10-CM | POA: Diagnosis not present

## 2020-11-11 NOTE — Progress Notes (Signed)
Orthopedic Tech Progress Note Patient Details:  Kendra Harper 02-Aug-1940 [id number]  Ortho Devices Type of Ortho Device: Coapt Ortho Device/Splint Location: LUE Ortho Device/Splint Interventions: Ordered, Application, Adjustment   Post Interventions Patient Tolerated: Fair Instructions Provided: Care of device, Poper ambulation with device  Izabel Chim 11/11/2020, 2:08 AM

## 2020-11-11 NOTE — Discharge Instructions (Addendum)
Call [facility] office in the morning to arrange follow-up. Squeeze ball or sponge throughout the day to keep blood from pooling in the hand. Return here for any new/acute changes-- numbness of the arm, loss of sensation, etc.

## 2020-11-11 NOTE — ED Notes (Signed)
Tried to call ortho. No answer

## 2020-11-12 DIAGNOSIS — S42309A Unspecified fracture of shaft of humerus, unspecified arm, initial encounter for closed fracture: Secondary | ICD-10-CM | POA: Insufficient documentation

## 2020-11-17 ENCOUNTER — Other Ambulatory Visit (HOSPITAL_COMMUNITY): Payer: Medicare Other

## 2020-11-17 ENCOUNTER — Encounter: Payer: Self-pay | Admitting: Cardiology

## 2020-11-17 DIAGNOSIS — I493 Ventricular premature depolarization: Secondary | ICD-10-CM | POA: Insufficient documentation

## 2020-11-17 DIAGNOSIS — I471 Supraventricular tachycardia: Secondary | ICD-10-CM | POA: Insufficient documentation

## 2020-11-17 DIAGNOSIS — I4719 Other supraventricular tachycardia: Secondary | ICD-10-CM | POA: Insufficient documentation

## 2020-11-18 ENCOUNTER — Encounter (HOSPITAL_COMMUNITY): Payer: Medicare Other

## 2020-11-26 ENCOUNTER — Emergency Department (HOSPITAL_COMMUNITY)
Admission: EM | Admit: 2020-11-26 | Discharge: 2020-11-26 | Disposition: A | Discharge status: Home / Self Care | Payer: Medicare Other | Attending: Emergency Medicine | Admitting: Emergency Medicine

## 2020-11-26 ENCOUNTER — Emergency Department (HOSPITAL_COMMUNITY): Payer: Medicare Other

## 2020-11-26 ENCOUNTER — Encounter (HOSPITAL_COMMUNITY): Payer: Self-pay | Admitting: Emergency Medicine

## 2020-11-26 ENCOUNTER — Other Ambulatory Visit: Payer: Self-pay

## 2020-11-26 DIAGNOSIS — M79602 Pain in left arm: Secondary | ICD-10-CM

## 2020-11-26 DIAGNOSIS — E119 Type 2 diabetes mellitus without complications: Secondary | ICD-10-CM | POA: Insufficient documentation

## 2020-11-26 DIAGNOSIS — Z794 Long term (current) use of insulin: Secondary | ICD-10-CM | POA: Diagnosis not present

## 2020-11-26 NOTE — ED Provider Notes (Signed)
Emergency Medicine Provider Triage Evaluation Note  Kendra Harper , a 80 y.o. female  was evaluated in triage.  Pt complains of left upper arm pain.  She states that she broke her humerus about recently and was seen at [facility] and placed in a splint which she is currently wearing.  She states that yesterday she was on her hands and knees and looking to make the bed when she lurched forward blood pressure in both ears although she states it did not hurt at that time but she states that today while she was in the bathroom an hour ago fastening her pants she had sudden onset severe left shoulder pain.  She came to the ER for evaluation of this.  Denies any numbness or weakness in her hands today no other trauma.  No other associated symptoms.  Review of Systems  Positive: Left arm pain Negative: Fever  Physical Exam  BP (!) 168/87   Pulse 76   Temp 98.7 F (37.1 C) (Oral)   Resp 14   SpO2 100%  Gen:   Awake, no distress   Resp:  Normal effort  MSK:   Moves extremities without difficulty Other:  Bilateral radial artery pulses 2+ and symmetric.  Good cap refill in all fingertips.  Sensation intact in C6, 7, 8 Radial medial and ulnar motor nerve intact.  Medical Decision Making  Medically screening exam initiated at 3:50 PM.  Appropriate orders placed.  Kendra Harper was informed that the remainder of the evaluation will be completed by another provider, this initial triage assessment does not replace that evaluation, and the importance of remaining in the ED until their evaluation is complete.  Will obtain x-rays.  Patient has distal neurovascular status that is reassuring.  He has no concern for movement of the fracture.  Patient has picture of her fracture.   Kendra Harper, [place] 11/26/20 1555    Kendra Merino, MD 11/28/20 2109

## 2020-11-26 NOTE — ED Triage Notes (Signed)
Patient presents to the ED by EMS with c/o severe pain today in the left arm upper after trying to fasten her pants. Hx: Broken humerus which is placed in a fixed soft splint. She is currently guarding and afraid to straighten it out.

## 2020-11-26 NOTE — ED Provider Notes (Signed)
[facility] EMERGENCY DEPARTMENT Provider Note   CSN: [id number] Arrival date & time: 11/26/20  1535     History Chief Complaint  Patient presents with   Arm Pain    Kendra Harper is a 80 y.o. female.  HPI Patient presents with left arm pain. She has a notable history of fall almost 1 month ago that resulted in a left arm humerus fracture.  She notes that she has been recovering generally well, wearing her splint.  Yesterday, after a somewhat awkward motion she had slight discomfort.  However, today, after innocuous motion felt sharp onset of severe pain throughout the upper arm. Currently pain is negligible, no weakness or inability to move the hand.  She did not have a complete fall, complains of no other issues.    Past Medical History:  Diagnosis Date   Allergic rhinitis    Diabetes mellitus without complication (HCC)    Glaucoma    Osteoporosis    PAT (paroxysmal atrial tachycardia) ([place])    noted on event monitor 10/2020   Protein calorie malnutrition ([place])    PVCs (premature ventricular contractions)     Patient Active Problem List   Diagnosis Date Noted   PVCs (premature ventricular contractions) 11/17/2020   PAT (paroxysmal atrial tachycardia) ([place]) 11/17/2020   Diabetes ([place]) 01/25/2016    Past Surgical History:  Procedure Laterality Date   ABDOMINAL HYSTERECTOMY     APPENDECTOMY     EYE SURGERY     heel surgery Bilateral    PARATHYROIDECTOMY     TONSILLECTOMY       OB History   No obstetric history on file.     Family History  Problem Relation Age of Onset   Diabetes Father    Heart failure Father    Diabetes Brother        type 1   Cancer Brother    CAD Paternal Grandmother        died of MI in 51's   CAD Paternal Grandfather        died of MI in 55's    Social History   Tobacco Use   Smoking status: Never   Smokeless tobacco: Never  Substance Use Topics   Alcohol use: No   Drug use: No    Home  Medications Prior to Admission medications   Medication Sig Start Date End Date Taking? Authorizing Provider  insulin aspart (NOVOLOG) 100 UNIT/ML injection 40 Units daily.    [provider]  latanoprost (XALATAN) 0.005 % ophthalmic solution Place 1 drop into both eyes at bedtime.    [provider]  Magnesium 250 MG TABS Take 250 mg by mouth 2 (two) times daily. 08/14/20   [provider]  ONE TOUCH ULTRA TEST test strip TEST BLOOD SUGAR 7 TIMES DAILY 07/08/16   Renato Shin, MD  oxyCODONE (ROXICODONE) 5 MG immediate release tablet Take 1 tablet (5 mg total) by mouth every 6 (six) hours as needed for up to 20 doses for severe pain. 11/07/20   Arnaldo Natal, MD    Allergies    Lyrica [pregabalin]  Review of Systems   Review of Systems  Constitutional:        Per HPI, otherwise negative  HENT:         Per HPI, otherwise negative  Respiratory:         Per HPI, otherwise negative  Cardiovascular:        Per HPI, otherwise negative  Gastrointestinal:  Negative for vomiting.  Endocrine:       Negative aside from HPI  Genitourinary:        Neg aside from HPI   Musculoskeletal:        Per HPI, otherwise negative  Skin: Negative.   Neurological:  Negative for syncope.   Physical Exam Updated Vital Signs BP (!) 168/87   Pulse 76   Temp 98.7 F (37.1 C) (Oral)   Resp 14   SpO2 100%   Physical Exam Vitals and nursing note reviewed.  Constitutional:      General: She is not in acute distress.    Appearance: She is well-developed.  HENT:     Head: Normocephalic and atraumatic.  Eyes:     Conjunctiva/sclera: Conjunctivae normal.  Cardiovascular:     Rate and Rhythm: Normal rate and regular rhythm.  Pulmonary:     Effort: Pulmonary effort is normal. No respiratory distress.     Breath sounds: Normal breath sounds. No stridor.  Abdominal:     General: There is no distension.  Musculoskeletal:       Arms:  Skin:    General: Skin is warm and dry.   Neurological:     Mental Status: She is alert and oriented to person, place, and time.     Cranial Nerves: No cranial nerve deficit.    ED Results / Procedures / Treatments   Labs (all labs ordered are listed, but only abnormal results are displayed) Labs Reviewed - No data to display  EKG None  Radiology DG Shoulder 1 View Left  Result Date: 11/26/2020 CLINICAL DATA:  Known humeral fracture, concern for shifting of fracture. EXAM: LEFT SHOULDER COMPARISON:  Radiograph November 10, 2020 FINDINGS: No significant change in alignment of the partially visualized midshaft humeral fracture. No new fracture visualized. Mild glenohumeral and acromioclavicular degenerative change. Productive change along the greater tubercle of the humerus. Visualized lung field is clear. Soft tissues are unremarkable. IMPRESSION: 1. No significant change in alignment of the partially visualized midshaft humeral fracture. Electronically Signed   By: Dahlia Bailiff MD   On: 11/26/2020 17:16   DG Humerus Left  Result Date: 11/26/2020 CLINICAL DATA:  Known humeral fracture, concern for displacement EXAM: LEFT HUMERUS - 2+ VIEW COMPARISON:  Radiograph November 10, 2020 FINDINGS: Displaced spiral fracture of the mid humerus, with decreased angulation but without significant change in the 1 shaft width anteromedial displacement of the distal fracture fragment. No evidence of interval bony bridging. Visualized lung fields are clear. IMPRESSION: Displaced spiral fracture of the mid humerus, with decreased angulation but without significant change in the 1 shaft width anteromedial displacement of the distal fracture fragment. Electronically Signed   By: Dahlia Bailiff MD   On: 11/26/2020 17:19    Procedures Procedures   Medications Ordered in ED Medications - No data to display  ED Course  I have reviewed the triage vital signs and the nursing notes.  Pertinent labs & imaging results that were available during my care of the  patient were reviewed by me and considered in my medical decision making (see chart for details). After initial evaluation I reviewed the patient's chart including documentation following a fall that occurred last month.  Subsequently she and I reviewed her x-rays from that date, the interval evaluation and today at bedside.  Patient's pain is minimal, no evidence for new fracture, no evidence for dislocation, patient comfortable with discharge with following up with orthopedist.    Final Clinical Impression(s) /  ED Diagnoses Final diagnoses:  Left arm pain    Rx / DC Orders ED Discharge Orders     None        Carmin Muskrat, MD 11/26/20 2127

## 2020-11-26 NOTE — Discharge Instructions (Addendum)
As discussed, your evaluation today has been largely reassuring.  But, it is important that you monitor your condition carefully, and do not hesitate to return to the ED if you develop new, or concerning changes in your condition. ? ?Otherwise, please follow-up with your physician for appropriate ongoing care. ? ?

## 2020-12-20 NOTE — Progress Notes (Signed)
Please place orders for PAT appointment scheduled 12/21/20.

## 2020-12-20 NOTE — Progress Notes (Addendum)
COVID Vaccine Completed: yes x3 Date COVID Vaccine completed: 07/23/19, 08/20/19 Has received booster:06/02/20 COVID vaccine manufacturer: Pfizer       Date of COVID positive in last 31 days:No  PCP - Andree Moro, DO Cardiologist - Fransico Him, MD  Chest x-ray - N/a EKG - 10/27/20 requested Stress Test - N/a ECHO - N/a Cardiac Cath - N/a Pacemaker/ICD device last checked: N/a Spinal Cord Stimulator: N/a  Sleep Study - N/a CPAP -   Fasting Blood Sugar - 120s Checks Blood Sugar __4___ times a day Insulin pump and glucose monitor. Diabetes coordinator notified. Per coordinator no need to turn of insulin pump during surgery since surgery is less than 2 hours. Instructed patient to call her endocrinologist about decreasing basal rate night before surgery.   Blood Thinner Instructions: N/a  Aspirin Instructions: Last Dose:  Activity level: Can go up a flight of stairs and perform activities of daily living without stopping and without symptoms of chest pain or shortness of breath.      Anesthesia review: DM, PVCs  Patient denies shortness of breath, fever, cough and chest pain at PAT appointment   Patient verbalized understanding of instructions that were given to them at the PAT appointment. Patient was also instructed that they will need to review over the PAT instructions again at home before surgery.

## 2020-12-20 NOTE — Patient Instructions (Addendum)
DUE TO COVID-19 ONLY ONE VISITOR IS ALLOWED TO COME WITH YOU AND STAY IN THE WAITING ROOM ONLY DURING PRE OP AND PROCEDURE.   **NO VISITORS ARE ALLOWED IN THE SHORT STAY AREA OR RECOVERY ROOM!!**   Your procedure is scheduled on: 12/28/20   Report to [facility] [place] Main  Entrance    Report to admitting at 1:15 PM   Call this number if you have problems the morning of surgery [phone]   Do not eat food :After Midnight.   May have liquids until  12:15 PM  day of surgery  CLEAR LIQUID DIET  Foods Allowed                                                                     Foods Excluded  Water, Black Coffee and tea, regular and decaf                liquids that you cannot  Plain Jell-O in any flavor  (No red)                                     see through such as: Fruit ices (not with fruit pulp)                                             milk, soups, orange juice              Iced Popsicles (No red)                                                All solid food                                   Apple juices Sports drinks like Gatorade (No red) Lightly seasoned clear broth or consume(fat free) Sugar, honey syrup     The day of surgery:  Drink ONE (1) Pre-Surgery Clear Ensure or G2 by 12:15 PM am the morning of surgery. Drink in one sitting. Do not sip.  This drink was given to you during your hospital  pre-op appointment visit. Nothing else to drink after completing the  Pre-Surgery Clear Ensure or G2.          If you have questions, please contact your surgeon's office.     Oral Hygiene is also important to reduce your risk of infection.                                    Remember - BRUSH YOUR TEETH THE MORNING OF SURGERY WITH YOUR REGULAR TOOTHPASTE   Take these medicines the morning of surgery with A SIP OF WATER: Oxycodone, Tylenol.  DO NOT TAKE ANY ORAL DIABETIC MEDICATIONS DAY OF YOUR SURGERY  How to Manage Your  Diabetes Before and After Surgery  Why is it  important to control my blood sugar before and after surgery? Improving blood sugar levels before and after surgery helps healing and can limit problems. A way of improving blood sugar control is eating a healthy diet by:  Eating less sugar and carbohydrates  Increasing activity/exercise  Talking with your doctor about reaching your blood sugar goals High blood sugars (greater than 180 mg/dL) can raise your risk of infections and slow your recovery, so you will need to focus on controlling your diabetes during the weeks before surgery. Make sure that the doctor who takes care of your diabetes knows about your planned surgery including the date and location.  How do I manage my blood sugar before surgery? Check your blood sugar at least 4 times a day, starting 2 days before surgery, to make sure that the level is not too high or low. Check your blood sugar the morning of your surgery when you wake up and every 2 hours until you get to the Short Stay unit. If your blood sugar is less than 70 mg/dL, you will need to treat for low blood sugar: Do not take insulin. Treat a low blood sugar (less than 70 mg/dL) with  cup of clear juice (cranberry or apple), 4 glucose tablets, OR glucose gel. Recheck blood sugar in 15 minutes after treatment (to make sure it is greater than 70 mg/dL). If your blood sugar is not greater than 70 mg/dL on recheck, call [phone] for further instructions. Report your blood sugar to the short stay nurse when you get to Short Stay.  If you are admitted to the hospital after surgery: Your blood sugar will be checked by the staff and you will probably be given insulin after surgery (instead of oral diabetes medicines) to make sure you have good blood sugar levels. The goal for blood sugar control after surgery is 80-180 mg/dL.    For patients with insulin pumps: Contact your diabetes doctor for specific instructions before surgery. Decrease basal rates by 20% at midnight  the night before your surgery. Note that if your surgery is planned to be longer than 2 hours, your insulin pump will be removed and intravenous (IV) insulin will be started and managed by the nurses and the anesthesiologist. You will be able to restart your insulin pump once you are awake and able to manage it.  Make sure to bring insulin pump supplies to the hospital with you in case the  site needs to be changed.   Please call your endocrinologist and ask about decreasing insulin pump night before surgery. Per hospital diabetic coordinator you do not need to turn off pump during surgery since procedure is less than 2 hours.  Reviewed and Endorsed by [facility] Patient Education Committee, August 2015                               You may not have any metal on your body including hair pins, jewelry, and body piercing             Do not wear make-up, lotions, powders, perfumes, or deodorant  Do not wear nail polish including gel and S&S, artificial/acrylic nails, or any other type of covering on natural nails including finger and toenails. If you have artificial nails, gel coating, etc. that needs to be removed by a nail salon please have this removed prior to surgery or surgery  may need to be canceled/ delayed if the surgeon/ anesthesia feels like they are unable to be safely monitored.   Do not shave  48 hours prior to surgery.               Do not bring valuables to the hospital. [place].     Patients discharged the day of surgery will not be allowed to drive home.  Special Instructions: Bring a copy of your healthcare power of attorney and living will documents         the day of surgery if you haven't scanned them in before.   Please read over the following fact sheets you were given: IF YOU HAVE QUESTIONS ABOUT YOUR PRE OP INSTRUCTIONS PLEASE CALL [phone]- [facility] - Preparing for Surgery Before surgery, you can  play an important role.  Because skin is not sterile, your skin needs to be as free of germs as possible.  You can reduce the number of germs on your skin by washing with CHG (chlorahexidine gluconate) soap before surgery.  CHG is an antiseptic cleaner which kills germs and bonds with the skin to continue killing germs even after washing. Please DO NOT use if you have an allergy to CHG or antibacterial soaps.  If your skin becomes reddened/irritated stop using the CHG and inform your nurse when you arrive at Short Stay. Do not shave (including legs and underarms) for at least 48 hours prior to the first CHG shower.  You may shave your face/neck.  Please follow these instructions carefully:  1.  Shower with CHG Soap the night before surgery and the  morning of surgery.  2.  If you choose to wash your hair, wash your hair first as usual with your normal  shampoo.  3.  After you shampoo, rinse your hair and body thoroughly to remove the shampoo.                             4.  Use CHG as you would any other liquid soap.  You can apply chg directly to the skin and wash.  Gently with a scrungie or clean washcloth.  5.  Apply the CHG Soap to your body ONLY FROM THE NECK DOWN.   Do   not use on face/ open                           Wound or open sores. Avoid contact with eyes, ears mouth and   genitals (private parts).                       Wash face,  Genitals (private parts) with your normal soap.             6.  Wash thoroughly, paying special attention to the area where your    surgery  will be performed.  7.  Thoroughly rinse your body with warm water from the neck down.  8.  DO NOT shower/wash with your normal soap after using and rinsing off the CHG Soap.                9.  Pat yourself dry with a clean towel.            10.  Wear clean pajamas.            11.  Place clean sheets on your bed the night of your first shower and do not  sleep with pets. Day of Surgery : Do not apply any  lotions/deodorants the morning of surgery.  Please wear clean clothes to the hospital/surgery center.  FAILURE TO FOLLOW THESE INSTRUCTIONS MAY RESULT IN THE CANCELLATION OF YOUR SURGERY  PATIENT SIGNATURE_________________________________  NURSE SIGNATURE__________________________________  ________________________________________________________________________     Kendra Harper  An incentive spirometer is a tool that can help keep your lungs clear and active. This tool measures how well you are filling your lungs with each breath. Taking long deep breaths may help reverse or decrease the chance of developing breathing (pulmonary) problems (especially infection) following: A long period of time when you are unable to move or be active. BEFORE THE PROCEDURE  If the spirometer includes an indicator to show your best effort, your nurse or respiratory therapist will set it to a desired goal. If possible, sit up straight or lean slightly forward. Try not to slouch. Hold the incentive spirometer in an upright position. INSTRUCTIONS FOR USE  Sit on the edge of your bed if possible, or sit up as far as you can in bed or on a chair. Hold the incentive spirometer in an upright position. Breathe out normally. Place the mouthpiece in your mouth and seal your lips tightly around it. Breathe in slowly and as deeply as possible, raising the piston or the ball toward the top of the column. Hold your breath for 3-5 seconds or for as long as possible. Allow the piston or ball to fall to the bottom of the column. Remove the mouthpiece from your mouth and breathe out normally. Rest for a few seconds and repeat Steps 1 through 7 at least 10 times every 1-2 hours when you are awake. Take your time and take a few normal breaths between deep breaths. The spirometer may include an indicator to show your best effort. Use the indicator as a goal to work toward during each repetition. After each set of 10  deep breaths, practice coughing to be sure your lungs are clear. If you have an incision (the cut made at the time of surgery), support your incision when coughing by placing a pillow or rolled up towels firmly against it. Once you are able to get out of bed, walk around indoors and cough well. You may stop using the incentive spirometer when instructed by your caregiver.  RISKS AND COMPLICATIONS Take your time so you do not get dizzy or light-headed. If you are in pain, you may need to take or ask for pain medication before doing incentive spirometry. It is harder to take a deep breath if you are having pain. AFTER USE Rest and breathe slowly and easily. It can be helpful to keep track of a log of your progress. Your caregiver can provide you with a simple table to help with this. If you are using the spirometer at home, follow these instructions: [place] IF:  You are having difficultly using the spirometer. You have trouble using the spirometer as often as instructed. Your pain medication is not giving enough relief while using the spirometer. You develop fever of 100.5 F (38.1 C) or higher. SEEK IMMEDIATE MEDICAL CARE IF:  You cough up bloody sputum that had not been present before. You develop fever of 102 F (38.9 C) or greater. You develop worsening pain at  or near the incision site. MAKE SURE YOU:  Understand these instructions. Will watch your condition. Will get help right away if you are not doing well or get worse. Document Released: 09/18/2006 Document Revised: 07/31/2011 Document Reviewed: 11/19/2006 [facility] Patient Information 2014 [place], [place].   ________________________________________________________________________

## 2020-12-21 ENCOUNTER — Other Ambulatory Visit: Payer: Self-pay

## 2020-12-21 ENCOUNTER — Encounter (HOSPITAL_COMMUNITY): Payer: Self-pay

## 2020-12-21 ENCOUNTER — Telehealth (HOSPITAL_COMMUNITY): Payer: Self-pay

## 2020-12-21 ENCOUNTER — Encounter (HOSPITAL_COMMUNITY)
Admission: RE | Admit: 2020-12-21 | Discharge: 2020-12-21 | Disposition: A | Discharge status: Home / Self Care | Payer: Medicare Other | Source: Ambulatory Visit | Attending: Orthopedic Surgery | Admitting: Orthopedic Surgery

## 2020-12-21 DIAGNOSIS — E109 Type 1 diabetes mellitus without complications: Secondary | ICD-10-CM | POA: Insufficient documentation

## 2020-12-21 DIAGNOSIS — Z01812 Encounter for preprocedural laboratory examination: Secondary | ICD-10-CM | POA: Diagnosis present

## 2020-12-21 DIAGNOSIS — W19XXXA Unspecified fall, initial encounter: Secondary | ICD-10-CM | POA: Insufficient documentation

## 2020-12-21 DIAGNOSIS — S42302B Unspecified fracture of shaft of humerus, left arm, initial encounter for open fracture: Secondary | ICD-10-CM | POA: Diagnosis not present

## 2020-12-21 HISTORY — DX: Unspecified osteoarthritis, unspecified site: M19.90

## 2020-12-21 HISTORY — DX: Pneumonia, unspecified organism: J18.9

## 2020-12-21 LAB — CBC
HCT: 42.6 % (ref 36.0–46.0)
Hemoglobin: 13.7 g/dL (ref 12.0–15.0)
MCH: 30.2 pg (ref 26.0–34.0)
MCHC: 32.2 g/dL (ref 30.0–36.0)
MCV: 94 fL (ref 80.0–100.0)
Platelets: 228 10*3/uL (ref 150–400)
RBC: 4.53 MIL/uL (ref 3.87–5.11)
RDW: 13.8 % (ref 11.5–15.5)
WBC: 4.8 10*3/uL (ref 4.0–10.5)
nRBC: 0 % (ref 0.0–0.2)

## 2020-12-21 LAB — BASIC METABOLIC PANEL
Anion gap: 9 (ref 5–15)
BUN: 20 mg/dL (ref 8–23)
CO2: 26 mmol/L (ref 22–32)
Calcium: 9.4 mg/dL (ref 8.9–10.3)
Chloride: 105 mmol/L (ref 98–111)
Creatinine, Ser: 0.59 mg/dL (ref 0.44–1.00)
GFR, Estimated: >60 mL/min (ref >60)
Glucose, Bld: 106 mg/dL — ABNORMAL HIGH (ref 70–99)
Potassium: 5.2 mmol/L — ABNORMAL HIGH (ref 3.5–5.1)
Sodium: 140 mmol/L (ref 135–145)

## 2020-12-21 LAB — GLUCOSE, CAPILLARY: Glucose-Capillary: 103 mg/dL — ABNORMAL HIGH (ref 70–99)

## 2020-12-21 NOTE — Telephone Encounter (Signed)
Received a fax requesting medical records EKG, from [facility] surgical Testing department. Records were un able to be faxed however, I did fax over saying  the patient is not being seen in our office.information  faxed to: [facility] testing department at [phone].

## 2020-12-23 ENCOUNTER — Telehealth: Payer: Self-pay | Admitting: Cardiology

## 2020-12-23 NOTE — Telephone Encounter (Signed)
   Name: Kendra Harper  DOB: 01/27/1941  MRN: [id number]  Primary Cardiologist: Fransico Him, MD  Chart reviewed as part of pre-operative protocol coverage. Because of Kendra Harper's past medical history and time since last visit, she will require a follow-up visit in order to better assess preoperative cardiovascular risk.  Per Dr. Radford Pax, she is requesting that the patient be seen in person prior to clearance.   Pre-op covering staff: - Please schedule appointment and call patient to inform them. If patient already had an upcoming appointment within acceptable timeframe, please add "pre-op clearance" to the appointment notes so provider is aware. - Please contact requesting surgeon's office via preferred method (i.e, phone, fax) to inform them of need for appointment prior to surgery.  If applicable, this message will also be routed to pharmacy pool and/or primary cardiologist for input on holding anticoagulant/antiplatelet agent as requested below so that this information is available to the clearing provider at time of patient's appointment.   Kathyrn Drown, NP  12/23/2020, 5:03 PM

## 2020-12-23 NOTE — Telephone Encounter (Signed)
Placed call to the pt. Per pt, she has broken her left arm and cannot do stress test or echocardiogram due to having to use her arm.  Please advise!

## 2020-12-23 NOTE — Telephone Encounter (Signed)
Dr. Radford Pax  This patient is scheduled to undergo humerus surgery 12/28/20. I see that you saw her 10/2020 with concerning chest pain symptoms. I would suggest that we attempt to get these complete prior to general anesthesia, would you agree?   CV call back, is there any room to move these studies forward as to not delay her surgery too much?   Thank you  Sharee Pimple

## 2020-12-23 NOTE — Progress Notes (Addendum)
Anesthesia Chart Review   Case: [id number] Date/Time: 12/28/20 1500   Procedure: OPEN REDUCTION INTERNAL FIXATION (ORIF) OF LEFT HUMERUS (Left)   Anesthesia type: Choice   Pre-op diagnosis: Left Humerus fracture   Location: [facility] ROOM 03 / [facility]   Surgeons: Nicholes Stairs, MD       DISCUSSION:80 y.o. never smoker with h/o DM II, left humerus fracture scheduled for above procedure 12/28/2020 with Dr. Victorino December.   Pt seen by cardiology 6/3/202. Per OV note pt experiencing exertional chest pain.  Stress test and Echo ordered at this time.    Pt will need clearance from cardiology, VM left with Dr. Dennie Maizes office.   Addendum 12/27/2020:  Pt seen by cardiology today for preoperative evaluation.  Per OV note, "This is a healthy 80 year old woman with type 1 diabetes well controlled on insulin for decades, presenting for preoperative cardiovascular evaluation.  The patient had been previously scheduled for an echocardiogram and nuclear stress test but these could not be completed because she fell and broke her arm.  She now requires surgical treatment with open reduction internal fixation of the left humerus.  The patient is able to walk 20 miles in a day without any cardiac-related symptoms.  She specifically denies chest pain or pressure.  There are no ischemic EKG changes.  She is at low risk for surgery and can proceed without further testing.  Once she has recovered from surgery, her cardiac testing (echo and nuclear stress test) can be completed as ordered by Dr. Radford Pax.  As part of her evaluation today, I reviewed her outpatient telemetry monitor from June which demonstrated normal sinus rhythm, premature supraventricular contractions, nonsustained atrial tachycardia, PVCs, and a few short ventricular runs without any sustained arrhythmia or documented atrial fibrillation.  As she is completely asymptomatic, she does not require any treatment at this time."  VS: BP (!) 149/76   Pulse 63    Temp 36.5 C (Oral)   Resp 14   Ht '5\' 1"'$  (1.549 m)   Wt 43.9 kg   SpO2 100%   BMI 18.29 kg/m   PROVIDERS: Andree Moro, DO is PCP   Fransico Him, MD is Cardiologist  LABS: Labs reviewed: Acceptable for surgery. (all labs ordered are listed, but only abnormal results are displayed)  Labs Reviewed  BASIC METABOLIC PANEL - Abnormal; Notable for the following components:      Result Value   Potassium 5.2 (*)    Glucose, Bld 106 (*)    All other components within normal limits  GLUCOSE, CAPILLARY - Abnormal; Notable for the following components:   Glucose-Capillary 103 (*)    All other components within normal limits  CBC     IMAGES:   EKG:   CV:  Past Medical History:  Diagnosis Date   Allergic rhinitis    Arthritis    Diabetes mellitus without complication (HCC)    Glaucoma    Osteoporosis    PAT (paroxysmal atrial tachycardia) (HCC)    noted on event monitor 10/2020   Pneumonia    Protein calorie malnutrition (HCC)    PVCs (premature ventricular contractions)     Past Surgical History:  Procedure Laterality Date   ABDOMINAL HYSTERECTOMY     APPENDECTOMY     EYE SURGERY     heel surgery Bilateral    PARATHYROIDECTOMY     TONSILLECTOMY      MEDICATIONS:  insulin aspart (NOVOLOG) 100 UNIT/ML injection   latanoprost (XALATAN) 0.005 % ophthalmic solution  Magnesium 250 MG TABS   ONE TOUCH ULTRA TEST test strip   oxyCODONE (ROXICODONE) 5 MG immediate release tablet   Polyethyl Glycol-Propyl Glycol (SYSTANE OP)   No current facility-administered medications for this encounter.   Konrad Felix, PA-C [facility] [phone]

## 2020-12-23 NOTE — Telephone Encounter (Signed)
Spoke with the patient who gives a different story of her chest pain than what seems documented during her visit. Dr. Radford Pax is currently on vacation as is therefore unavailable to review situation. I wonder if the patient can be fit onto DOD schedule as to not delay surgery? She also reports that she will likely not be under general anesthesia, but a nerve block.

## 2020-12-23 NOTE — Telephone Encounter (Signed)
   [facility] Pre-operative Risk Assessment    Patient Name: Kendra Harper  DOB: 1940-10-21 MRN: [id number]  HEARTCARE STAFF:  - IMPORTANT!!!!!! Under Visit Info/Reason for Call, type in Other and utilize the format Clearance MM/DD/YY or Clearance TBD. Do not use dashes or single digits. - Please review there is not already an duplicate clearance open for this procedure. - If request is for dental extraction, please clarify the # of teeth to be extracted. - If the patient is currently at the dentist's office, call Pre-Op Callback Staff (MA/nurse) to input urgent request.  - If the patient is not currently in the dentist office, please route to the Pre-Op pool.  Request for surgical clearance:  What type of surgery is being performed? ORIS of L Humerus   When is this surgery scheduled? 12/28/20  What type of clearance is required (medical clearance vs. Pharmacy clearance to hold med vs. Both)? Medical   Are there any medications that need to be held prior to surgery and how long? no  Practice name and name of physician performing surgery? Dr. Ramonita Lab, [facility]  What is the office phone number? [phone]   7.   What is the office fax number? [phone] Attn: Marianna Fuss  8.   Anesthesia type (None, local, MAC, general) ? Choice   Kendra Harper 12/23/2020, 12:42 PM  _________________________________________________________________   (provider comments below)

## 2020-12-24 ENCOUNTER — Encounter (HOSPITAL_COMMUNITY): Payer: Medicare Other

## 2020-12-24 NOTE — Telephone Encounter (Signed)
Rachael from [facility] need test results from the 10/27/20 faxed to [phone]

## 2020-12-24 NOTE — Telephone Encounter (Signed)
Can you be more specific. There are no labs from 10/27/20 in our system for this patient

## 2020-12-24 NOTE — Telephone Encounter (Signed)
Pt is scheduled to see or Doctor of the Day, Dr. Burt Knack, Monday, 12/27/2020 and clearance will be addressed at that visit, per Dr. Radford Pax. Will route to the requesting surgeon's office to make them aware.

## 2020-12-27 ENCOUNTER — Ambulatory Visit (INDEPENDENT_AMBULATORY_CARE_PROVIDER_SITE_OTHER): Payer: Medicare Other | Admitting: Cardiovascular Disease

## 2020-12-27 ENCOUNTER — Other Ambulatory Visit (HOSPITAL_COMMUNITY): Payer: Medicare Other

## 2020-12-27 ENCOUNTER — Encounter: Payer: Self-pay | Admitting: Cardiovascular Disease

## 2020-12-27 ENCOUNTER — Other Ambulatory Visit: Payer: Self-pay

## 2020-12-27 VITALS — BP 140/74 | HR 78 | Ht 61.0 in | Wt 97.8 lb

## 2020-12-27 DIAGNOSIS — Z01810 Encounter for preprocedural cardiovascular examination: Secondary | ICD-10-CM | POA: Diagnosis not present

## 2020-12-27 NOTE — Patient Instructions (Signed)
Medication Instructions:  No changes *If you need a refill on your cardiac medications before your next appointment, please call your pharmacy*   Lab Work: none If you have labs (blood work) drawn today and your tests are completely normal, you will receive your results only by: [facility] (if you have MyChart) OR A paper copy in the mail If you have any lab test that is abnormal or we need to change your treatment, we will call you to review the results.   Testing/Procedures: None - please plan to complete the previously ordered testing once you have recovered from surgery   Follow-Up: As planned  Other Instructions

## 2020-12-27 NOTE — Anesthesia Preprocedure Evaluation (Addendum)
Anesthesia Evaluation  Patient identified by MRN, date of birth, ID band Patient awake    Reviewed: Allergy & Precautions, NPO status , Patient's Chart, lab work & pertinent test results  Airway Mallampati: I  TM Distance: >3 FB Neck ROM: Full    Dental no notable dental hx.    Pulmonary    Pulmonary exam normal breath sounds clear to auscultation       Cardiovascular Exercise Tolerance: Good Normal cardiovascular exam+ dysrhythmias (paroxysmal atrial tachycardia; PVCs)  Rhythm:Regular Rate:Normal     Neuro/Psych negative neurological ROS  negative psych ROS   GI/Hepatic negative GI ROS, Neg liver ROS,   Endo/Other  diabetes (has insulin pump with basal rate still going BS 206), Well Controlled, Type 1, Insulin Dependent  Renal/GU negative Renal ROS  negative genitourinary   Musculoskeletal  (+) Arthritis , Osteoarthritis,    Abdominal   Peds negative pediatric ROS (+)  Hematology negative hematology ROS (+)   Anesthesia Other Findings   Reproductive/Obstetrics negative OB ROS                          Anesthesia Physical Anesthesia Plan  ASA: 2 and emergent  Anesthesia Plan: General and Regional   Post-op Pain Management:  Regional for Post-op pain and GA combined w/ Regional for post-op pain   Induction: Intravenous  PONV Risk Score and Plan: 3 and Treatment may vary due to age or medical condition, Propofol infusion and Ondansetron  Airway Management Planned: Oral ETT  Additional Equipment: None  Intra-op Plan:   Post-operative Plan: Extubation in OR  Informed Consent: I have reviewed the patients History and Physical, chart, labs and discussed the procedure including the risks, benefits and alternatives for the proposed anesthesia with the patient or authorized representative who has indicated his/her understanding and acceptance.     Dental advisory given  Plan  Discussed with: CRNA and Anesthesiologist  Anesthesia Plan Comments: (Interscalene block with Exparel. GETA. Norton Blizzard, MD  )      Anesthesia Quick Evaluation   Cardiology consult from 12/27/20: 1. This is a healthy 80 year old woman with type 1 diabetes well controlled on insulin for decades, presenting for preoperative cardiovascular evaluation.  The patient had been previously scheduled for an echocardiogram and nuclear stress test but these could not be completed because she fell and broke her arm.  She now requires surgical treatment with open reduction internal fixation of the left humerus.  The patient is able to walk 20 miles in a day without any cardiac-related symptoms.  She specifically denies chest pain or pressure.  There are no ischemic EKG changes.  She is at low risk for surgery and can proceed without further testing.  Once she has recovered from surgery, her cardiac testing (echo and nuclear stress test) can be completed as ordered by Dr. Radford Pax.  As part of her evaluation today, I reviewed her outpatient telemetry monitor from June which demonstrated normal sinus rhythm, premature supraventricular contractions, nonsustained atrial tachycardia, PVCs, and a few short ventricular runs without any sustained arrhythmia or documented atrial fibrillation.  As she is completely asymptomatic, she does not require any treatment at this time.

## 2020-12-27 NOTE — Progress Notes (Signed)
Cardiology Office Note:    Date:  12/27/2020   ID:  Kendra Harper, DOB 09-Oct-1940, MRN [id number]  PCP:  Andree Moro, DO   [facility] Providers Cardiologist:  Fransico Him, MD     Referring MD: Andree Moro, DO   Chief Complaint  Patient presents with   Pre-op Exam    History of Present Illness:    Kendra Harper is a 80 y.o. female presenting for preoperative evaluation.  The patient is followed by Dr. Radford Pax.  She has a history of type 1 diabetes and she was recently evaluated in June 2022 for an abnormal EKG.  She had a mechanical fall in June '22 and sustained a spiral fracture of the left humerus. She reports that surgery was considered at the time of her injury, but ultimately a trial of conservative care and physical therapy was attempted. She hasn't had sufficient improvement and is scheduled for surgery tomorrow by Dr Stann Mainland.  In reviewing her cardiac history, she tells me that her PCP noticed an 'irregular heart beat' in March of this year. She was referred to Dr Radford Pax and had an event monitor performed. A stress test and echo were ordered and these were not completed because she broke her arm just before they were scheduled. She has been an avid walker, walking about 20 miles/day divided into 4 separate 5 mile walks. She reports no symptoms of chest pain or shortness of breath with walking. She has rare shortness of breath at rest and feels like this might be coming from a problem with her sinuses. She denies any history of heart palpitations, lightheadedness, or syncope.   Past Medical History:  Diagnosis Date   Allergic rhinitis    Arthritis    Diabetes mellitus without complication (HCC)    Glaucoma    Osteoporosis    PAT (paroxysmal atrial tachycardia) ([place])    noted on event monitor 10/2020   Pneumonia    Protein calorie malnutrition (HCC)    PVCs (premature ventricular contractions)     Past Surgical History:  Procedure Laterality Date   ABDOMINAL  HYSTERECTOMY     APPENDECTOMY     EYE SURGERY     heel surgery Bilateral    PARATHYROIDECTOMY     TONSILLECTOMY      Current Medications: Current Meds  Medication Sig   insulin aspart (NOVOLOG) 100 UNIT/ML injection Inject into the skin continuous. Via pump   latanoprost (XALATAN) 0.005 % ophthalmic solution Place 1 drop into both eyes at bedtime.   Polyethyl Glycol-Propyl Glycol (SYSTANE OP) Place 1 drop into both eyes 2 (two) times daily as needed (dry eyes).     Allergies:   Lyrica [pregabalin]   Social History   Socioeconomic History   Marital status: Widowed    Spouse name: Not on file   Number of children: Not on file   Years of education: Not on file   Highest education level: Not on file  Occupational History   Not on file  Tobacco Use   Smoking status: Never   Smokeless tobacco: Never  Vaping Use   Vaping Use: Never used  Substance and Sexual Activity   Alcohol use: No   Drug use: No   Sexual activity: Not on file  Other Topics Concern   Not on file  Social History Narrative   Not on file   Social Determinants of Health   Financial Resource Strain: Not on file  Food Insecurity: Not on file  Transportation Needs: Not on  file  Physical Activity: Not on file  Stress: Not on file  Social Connections: Not on file     Family History: The patient's family history includes CAD in her paternal grandfather and paternal grandmother; Cancer in her brother; Diabetes in her brother and father; Heart failure in her father.  ROS:   Please see the history of present illness.    All other systems reviewed and are negative.  EKGs/Labs/Other Studies Reviewed:    The following studies were reviewed today: Event Monitor 11/16/2020:  Study Highlights  Predominant rhythm was normal sinus rhythm with average heart rate 80bpm and ranged from 49 to 160bpm. Frequent PACs, atrial couplets and nonsutained atrial tachycardia. Occsaional PVCs, bigeminal PVCs Wide complex  tachycardia up to 7 beats, cannot rule out nonsustained ventricular tachycardia vs. atrial tachycardia with aberration   EKG:  EKG is ordered today.  The ekg ordered today demonstrates normal sinus rhythm 78 bpm, nonspecific ST abnormality.  Recent Labs: 12/21/2020: BUN 20; Creatinine, Ser 0.59; Hemoglobin 13.7; Platelets 228; Potassium 5.2; Sodium 140  Recent Lipid Panel No results found for: CHOL, TRIG, HDL, CHOLHDL, VLDL, LDLCALC, LDLDIRECT   Risk Assessment/Calculations:           Physical Exam:    VS:  BP 140/74   Pulse 78   Ht '5\' 1"'$  (1.549 m)   Wt 97 lb 12.8 oz (44.4 kg)   SpO2 97%   BMI 18.48 kg/m     Wt Readings from Last 3 Encounters:  12/27/20 97 lb 12.8 oz (44.4 kg)  12/21/20 96 lb 12.8 oz (43.9 kg)  11/07/20 97 lb (44 kg)     GEN:  Well nourished, well developed in no acute distress HEENT: Normal NECK: No JVD; No carotid bruits LYMPHATICS: No lymphadenopathy CARDIAC: RRR, no murmurs, rubs, gallops RESPIRATORY:  Clear to auscultation without rales, wheezing or rhonchi  ABDOMEN: Soft, non-tender, non-distended MUSCULOSKELETAL:  No edema; No deformity  SKIN: Warm and dry NEUROLOGIC:  Alert and oriented x 3 PSYCHIATRIC:  Normal affect   ASSESSMENT:    1. Preop cardiovascular exam    PLAN:    In order of problems listed above:  This is a healthy 80 year old woman with type 1 diabetes well controlled on insulin for decades, presenting for preoperative cardiovascular evaluation.  The patient had been previously scheduled for an echocardiogram and nuclear stress test but these could not be completed because she fell and broke her arm.  She now requires surgical treatment with open reduction internal fixation of the left humerus.  The patient is able to walk 20 miles in a day without any cardiac-related symptoms.  She specifically denies chest pain or pressure.  There are no ischemic EKG changes.  She is at low risk for surgery and can proceed without further  testing.  Once she has recovered from surgery, her cardiac testing (echo and nuclear stress test) can be completed as ordered by Dr. Radford Pax.  As part of her evaluation today, I reviewed her outpatient telemetry monitor from June which demonstrated normal sinus rhythm, premature supraventricular contractions, nonsustained atrial tachycardia, PVCs, and a few short ventricular runs without any sustained arrhythmia or documented atrial fibrillation.  As she is completely asymptomatic, she does not require any treatment at this time.  Medication Adjustments/Labs and Tests Ordered: Current medicines are reviewed at length with the patient today.  Concerns regarding medicines are outlined above.  No orders of the defined types were placed in this encounter.  No orders of the defined  types were placed in this encounter.   Patient Instructions  Medication Instructions:  No changes *If you need a refill on your cardiac medications before your next appointment, please call your pharmacy*   Lab Work: none If you have labs (blood work) drawn today and your tests are completely normal, you will receive your results only by: [facility] (if you have MyChart) OR A paper copy in the mail If you have any lab test that is abnormal or we need to change your treatment, we will call you to review the results.   Testing/Procedures: None - please plan to complete the previously ordered testing once you have recovered from surgery   Follow-Up: As planned  Other Instructions     Signed, Sherren Mocha, MD  12/27/2020 11:31 AM    [place]

## 2020-12-28 ENCOUNTER — Ambulatory Visit (HOSPITAL_COMMUNITY): Payer: Medicare Other | Admitting: Anesthesiology

## 2020-12-28 ENCOUNTER — Encounter (HOSPITAL_COMMUNITY)
Admission: RE | Disposition: A | Discharge status: Home / Self Care | Payer: Self-pay | Source: Home / Self Care | Attending: Orthopedic Surgery

## 2020-12-28 ENCOUNTER — Encounter (HOSPITAL_COMMUNITY): Payer: Self-pay | Admitting: Orthopedic Surgery

## 2020-12-28 ENCOUNTER — Ambulatory Visit (HOSPITAL_COMMUNITY): Payer: Medicare Other

## 2020-12-28 ENCOUNTER — Ambulatory Visit (HOSPITAL_COMMUNITY): Payer: Medicare Other | Admitting: Physician Assistant

## 2020-12-28 ENCOUNTER — Ambulatory Visit (HOSPITAL_COMMUNITY)
Admission: RE | Admit: 2020-12-28 | Discharge: 2020-12-28 | Disposition: A | Discharge status: Home / Self Care | Payer: Medicare Other | Attending: Orthopedic Surgery | Admitting: Orthopedic Surgery

## 2020-12-28 DIAGNOSIS — Z888 Allergy status to other drugs, medicaments and biological substances status: Secondary | ICD-10-CM | POA: Diagnosis not present

## 2020-12-28 DIAGNOSIS — Z794 Long term (current) use of insulin: Secondary | ICD-10-CM | POA: Diagnosis not present

## 2020-12-28 DIAGNOSIS — S42342A Displaced spiral fracture of shaft of humerus, left arm, initial encounter for closed fracture: Secondary | ICD-10-CM

## 2020-12-28 DIAGNOSIS — E119 Type 2 diabetes mellitus without complications: Secondary | ICD-10-CM | POA: Insufficient documentation

## 2020-12-28 DIAGNOSIS — Z833 Family history of diabetes mellitus: Secondary | ICD-10-CM | POA: Diagnosis not present

## 2020-12-28 DIAGNOSIS — Z419 Encounter for procedure for purposes other than remedying health state, unspecified: Secondary | ICD-10-CM

## 2020-12-28 DIAGNOSIS — Z90710 Acquired absence of both cervix and uterus: Secondary | ICD-10-CM | POA: Insufficient documentation

## 2020-12-28 DIAGNOSIS — X58XXXA Exposure to other specified factors, initial encounter: Secondary | ICD-10-CM | POA: Diagnosis not present

## 2020-12-28 DIAGNOSIS — Z8249 Family history of ischemic heart disease and other diseases of the circulatory system: Secondary | ICD-10-CM | POA: Insufficient documentation

## 2020-12-28 DIAGNOSIS — S42392A Other fracture of shaft of left humerus, initial encounter for closed fracture: Secondary | ICD-10-CM | POA: Diagnosis present

## 2020-12-28 DIAGNOSIS — H409 Unspecified glaucoma: Secondary | ICD-10-CM | POA: Diagnosis not present

## 2020-12-28 DIAGNOSIS — Z9049 Acquired absence of other specified parts of digestive tract: Secondary | ICD-10-CM | POA: Diagnosis not present

## 2020-12-28 HISTORY — PX: ORIF HUMERUS FRACTURE: SHX2126

## 2020-12-28 LAB — GLUCOSE, CAPILLARY
Glucose-Capillary: 207 mg/dL — ABNORMAL HIGH (ref 70–99)
Glucose-Capillary: 84 mg/dL (ref 70–99)

## 2020-12-28 SURGERY — OPEN REDUCTION INTERNAL FIXATION (ORIF) HUMERAL SHAFT FRACTURE
Anesthesia: Regional | Laterality: Left

## 2020-12-28 MED ORDER — TRANEXAMIC ACID-NACL 1000-0.7 MG/100ML-% IV SOLN
1000.0000 mg | INTRAVENOUS | Status: AC
  Administered 2020-12-28: 1000 mg via INTRAVENOUS
  Filled 2020-12-28: qty 100

## 2020-12-28 MED ORDER — PHENYLEPHRINE HCL-NACL 20-0.9 MG/250ML-% IV SOLN
INTRAVENOUS | Status: DC | PRN
  Administered 2020-12-28: 40 ug/min via INTRAVENOUS

## 2020-12-28 MED ORDER — DEXAMETHASONE SODIUM PHOSPHATE 10 MG/ML IJ SOLN
INTRAMUSCULAR | Status: AC
  Filled 2020-12-28: qty 1

## 2020-12-28 MED ORDER — CEFAZOLIN SODIUM-DEXTROSE 2-4 GM/100ML-% IV SOLN
2.0000 g | INTRAVENOUS | Status: AC
  Administered 2020-12-28: 2 g via INTRAVENOUS
  Filled 2020-12-28: qty 100

## 2020-12-28 MED ORDER — PROPOFOL 10 MG/ML IV BOLUS
INTRAVENOUS | Status: AC
  Filled 2020-12-28: qty 20

## 2020-12-28 MED ORDER — LIDOCAINE 2% (20 MG/ML) 5 ML SYRINGE
INTRAMUSCULAR | Status: DC | PRN
  Administered 2020-12-28: 60 mg via INTRAVENOUS

## 2020-12-28 MED ORDER — OXYCODONE HCL 5 MG PO TABS: 5.0000 mg | ORAL_TABLET | Freq: Once | ORAL | Status: DC | PRN

## 2020-12-28 MED ORDER — ONDANSETRON HCL 4 MG/2ML IJ SOLN
INTRAMUSCULAR | Status: DC | PRN
  Administered 2020-12-28: 4 mg via INTRAVENOUS

## 2020-12-28 MED ORDER — LACTATED RINGERS IV SOLN: INTRAVENOUS | Status: DC

## 2020-12-28 MED ORDER — ROCURONIUM BROMIDE 10 MG/ML (PF) SYRINGE
PREFILLED_SYRINGE | INTRAVENOUS | Status: AC
  Filled 2020-12-28: qty 10

## 2020-12-28 MED ORDER — ONDANSETRON HCL 4 MG/2ML IJ SOLN
INTRAMUSCULAR | Status: AC
  Filled 2020-12-28: qty 2

## 2020-12-28 MED ORDER — BUPIVACAINE LIPOSOME 1.3 % IJ SUSP
INTRAMUSCULAR | Status: DC | PRN
  Administered 2020-12-28: 10 mL via PERINEURAL

## 2020-12-28 MED ORDER — ONDANSETRON HCL 4 MG/2ML IJ SOLN
4.0000 mg | Freq: Once | INTRAMUSCULAR | Status: AC | PRN
  Administered 2020-12-28: 4 mg via INTRAVENOUS

## 2020-12-28 MED ORDER — 0.9 % SODIUM CHLORIDE (POUR BTL) OPTIME
TOPICAL | Status: DC | PRN
  Administered 2020-12-28: 1000 mL

## 2020-12-28 MED ORDER — FENTANYL CITRATE (PF) 100 MCG/2ML IJ SOLN
INTRAMUSCULAR | Status: DC | PRN
  Administered 2020-12-28: 25 ug via INTRAVENOUS

## 2020-12-28 MED ORDER — ROCURONIUM BROMIDE 10 MG/ML (PF) SYRINGE
PREFILLED_SYRINGE | INTRAVENOUS | Status: DC | PRN
  Administered 2020-12-28: 60 mg via INTRAVENOUS

## 2020-12-28 MED ORDER — AMISULPRIDE (ANTIEMETIC) 5 MG/2ML IV SOLN: 10.0000 mg | Freq: Once | INTRAVENOUS | Status: DC | PRN

## 2020-12-28 MED ORDER — ONDANSETRON HCL 4 MG PO TABS: 4.0000 mg | ORAL_TABLET | Freq: Three times a day (TID) | ORAL | 1 refills | Status: DC | PRN

## 2020-12-28 MED ORDER — VANCOMYCIN HCL 1000 MG IV SOLR
INTRAVENOUS | Status: AC
  Filled 2020-12-28: qty 1000

## 2020-12-28 MED ORDER — ORAL CARE MOUTH RINSE: 15.0000 mL | Freq: Once | OROMUCOSAL | Status: AC

## 2020-12-28 MED ORDER — FENTANYL CITRATE (PF) 100 MCG/2ML IJ SOLN
INTRAMUSCULAR | Status: AC
  Filled 2020-12-28: qty 2

## 2020-12-28 MED ORDER — PROPOFOL 10 MG/ML IV BOLUS
INTRAVENOUS | Status: DC | PRN
  Administered 2020-12-28: 120 mg via INTRAVENOUS

## 2020-12-28 MED ORDER — FENTANYL CITRATE (PF) 100 MCG/2ML IJ SOLN
50.0000 ug | INTRAMUSCULAR | Status: AC
  Administered 2020-12-28: 50 ug via INTRAVENOUS
  Filled 2020-12-28: qty 2

## 2020-12-28 MED ORDER — CHLORHEXIDINE GLUCONATE 0.12 % MT SOLN
15.0000 mL | Freq: Once | OROMUCOSAL | Status: AC
  Administered 2020-12-28: 15 mL via OROMUCOSAL

## 2020-12-28 MED ORDER — PHENYLEPHRINE 40 MCG/ML (10ML) SYRINGE FOR IV PUSH (FOR BLOOD PRESSURE SUPPORT)
PREFILLED_SYRINGE | INTRAVENOUS | Status: DC | PRN
  Administered 2020-12-28 (×2): 80 ug via INTRAVENOUS

## 2020-12-28 MED ORDER — LIDOCAINE 2% (20 MG/ML) 5 ML SYRINGE
INTRAMUSCULAR | Status: AC
  Filled 2020-12-28: qty 5

## 2020-12-28 MED ORDER — OXYCODONE HCL 5 MG/5ML PO SOLN: 5.0000 mg | Freq: Once | ORAL | Status: DC | PRN

## 2020-12-28 MED ORDER — LABETALOL HCL 5 MG/ML IV SOLN
5.0000 mg | Freq: Once | INTRAVENOUS | Status: AC
  Administered 2020-12-28: 5 mg via INTRAVENOUS
  Filled 2020-12-28: qty 4

## 2020-12-28 MED ORDER — ACETAMINOPHEN 500 MG PO TABS
1000.0000 mg | ORAL_TABLET | Freq: Once | ORAL | Status: AC
  Administered 2020-12-28: 1000 mg via ORAL
  Filled 2020-12-28: qty 2

## 2020-12-28 MED ORDER — FENTANYL CITRATE (PF) 100 MCG/2ML IJ SOLN: 25.0000 ug | INTRAMUSCULAR | Status: DC | PRN

## 2020-12-28 MED ORDER — OXYCODONE HCL 5 MG PO TABS: 5.0000 mg | ORAL_TABLET | Freq: Four times a day (QID) | ORAL | 0 refills | Status: DC | PRN

## 2020-12-28 MED ORDER — DEXAMETHASONE SODIUM PHOSPHATE 10 MG/ML IJ SOLN
INTRAMUSCULAR | Status: DC | PRN
  Administered 2020-12-28: 4 mg via INTRAVENOUS

## 2020-12-28 MED ORDER — SUGAMMADEX SODIUM 200 MG/2ML IV SOLN
INTRAVENOUS | Status: DC | PRN
  Administered 2020-12-28: 150 mg via INTRAVENOUS

## 2020-12-28 MED ORDER — VANCOMYCIN HCL 1000 MG IV SOLR
INTRAVENOUS | Status: DC | PRN
  Administered 2020-12-28: 1000 mg

## 2020-12-28 MED ORDER — BUPIVACAINE HCL (PF) 0.5 % IJ SOLN
INTRAMUSCULAR | Status: DC | PRN
  Administered 2020-12-28: 15 mL via PERINEURAL

## 2020-12-28 MED ORDER — ACETAMINOPHEN 500 MG PO TABS
ORAL_TABLET | ORAL | Status: AC
  Filled 2020-12-28: qty 1

## 2020-12-28 SURGICAL SUPPLY — 52 items
BAG COUNTER SPONGE SURGICOUNT (BAG) IMPLANT
BAG ZIPLOCK 12X15 (MISCELLANEOUS) ×2 IMPLANT
BIT DRILL L45 QC 2.7X125 (BIT) ×1 IMPLANT
BIT DRILL QC 2.5X135 (BIT) ×2 IMPLANT
BIT DRILL QC 2.7X125 (BIT) ×2
BIT DRILL QC 2.8X135 (BIT) ×2 IMPLANT
BIT DRILL QC 2X140 (BIT) ×2 IMPLANT
CHLORAPREP W/TINT 26 (MISCELLANEOUS) ×2 IMPLANT
COVER SURGICAL LIGHT HANDLE (MISCELLANEOUS) ×2 IMPLANT
DRAPE C-ARM 42X120 X-RAY (DRAPES) ×2 IMPLANT
DRAPE U-SHAPE 47X51 STRL (DRAPES) ×2 IMPLANT
DRSG ADAPTIC 3X8 NADH LF (GAUZE/BANDAGES/DRESSINGS) ×2 IMPLANT
DRSG AQUACEL AG ADV 3.5X10 (GAUZE/BANDAGES/DRESSINGS) ×2 IMPLANT
DRSG PAD ABDOMINAL 8X10 ST (GAUZE/BANDAGES/DRESSINGS) ×2 IMPLANT
ELECT REM PT RETURN 15FT ADLT (MISCELLANEOUS) ×2 IMPLANT
GAUZE SPONGE 4X4 12PLY STRL (GAUZE/BANDAGES/DRESSINGS) IMPLANT
GLOVE SRG 8 PF TXTR STRL LF DI (GLOVE) ×2 IMPLANT
GLOVE SURG ENC MOIS LTX SZ7.5 (GLOVE) ×4 IMPLANT
GLOVE SURG UNDER POLY LF SZ8 (GLOVE) ×2
GOWN STRL REUS W/TWL XL LVL3 (GOWN DISPOSABLE) ×6 IMPLANT
KIT BASIN OR (CUSTOM PROCEDURE TRAY) ×2 IMPLANT
KIT TURNOVER KIT A (KITS) ×2 IMPLANT
MANIFOLD NEPTUNE II (INSTRUMENTS) ×2 IMPLANT
NEEDLE TAPERED W/ NITINOL LOOP (MISCELLANEOUS) IMPLANT
NS IRRIG 1000ML POUR BTL (IV SOLUTION) ×2 IMPLANT
PACK SHOULDER (CUSTOM PROCEDURE TRAY) ×2 IMPLANT
PROS LCP PLATE 12 163M (Plate) ×2 IMPLANT
PROSTHESIS LCP PLATE 12 163M (Plate) ×1 IMPLANT
PROTECTOR NERVE ULNAR (MISCELLANEOUS) IMPLANT
SCREW CORT ST T8 SD REC 2.7X20 (Screw) ×2 IMPLANT
SCREW CORTEX 2.7X22 ST (Screw) ×1 IMPLANT
SCREW CORTEX 3.5 26MM (Screw) ×2 IMPLANT
SCREW CORTICAL 2.7X24MM (Screw) ×2 IMPLANT
SCREW LOCK CORT ST 3.5X26 (Screw) ×2 IMPLANT
SCREW LOCK T15 FT 24X3.5X2.9X (Screw) ×3 IMPLANT
SCREW LOCKING 3.5X24 (Screw) ×3 IMPLANT
SCREW LOCKING 3.5X26 (Screw) ×4 IMPLANT
SCREW SELF TAP 22M (Screw) ×1 IMPLANT
SLING ARM FOAM STRAP MED (SOFTGOODS) ×2 IMPLANT
SPONGE T-LAP 18X18 ~~LOC~~+RFID (SPONGE) ×4 IMPLANT
STRIP CLOSURE SKIN 1/2X4 (GAUZE/BANDAGES/DRESSINGS) ×2 IMPLANT
SUT FIBERWIRE #2 38 T-5 BLUE (SUTURE)
SUT MNCRL AB 3-0 PS2 18 (SUTURE) ×2 IMPLANT
SUT VIC AB 0 CT1 27 (SUTURE) ×1
SUT VIC AB 0 CT1 27XBRD ANTBC (SUTURE) ×1 IMPLANT
SUT VIC AB 1 CT1 36 (SUTURE) ×2 IMPLANT
SUT VIC AB 2-0 CT1 27 (SUTURE) ×2
SUT VIC AB 2-0 CT1 TAPERPNT 27 (SUTURE) ×2 IMPLANT
SUTURE FIBERWR #2 38 T-5 BLUE (SUTURE) IMPLANT
TOWEL OR 17X26 10 PK STRL BLUE (TOWEL DISPOSABLE) ×2 IMPLANT
UNDERPAD 30X36 HEAVY ABSORB (UNDERPADS AND DIAPERS) ×2 IMPLANT
WATER STERILE IRR 1000ML POUR (IV SOLUTION) ×2 IMPLANT

## 2020-12-28 NOTE — Progress Notes (Signed)
AssistedDr. Elgie Congo with left, ultrasound guided, interscalene  block. Side rails up, monitors on throughout procedure. See vital signs in flow sheet. Tolerated Procedure well.

## 2020-12-28 NOTE — Discharge Instructions (Signed)
-   Maintain postoperative bandage until your follow-up appointment.  This is water occlusive and you may begin showering on postoperative day #2.  Please do not submerge underwater.  -Maintain your sling at all times unless doing activities of daily living or gentle range of motion exercises with the left arm.  Do not lift her shoulder above 90 degrees.  No lifting over 2 pounds.  -You do not need to don your sling for sleeping.  -Apply ice to the left humerus and surgical site for 20 to 30 minutes out of each hour that you are able around-the-clock.  -For mild to moderate pain use Tylenol and Advil as needed around-the-clock.  For breakthrough pain use oxycodone as necessary.  -Return to see Dr. Stann Mainland in 2 weeks in the office.

## 2020-12-28 NOTE — Transfer of Care (Signed)
Immediate Anesthesia Transfer of Care Note  Patient: Kendra Harper  Procedure(s) Performed: OPEN REDUCTION INTERNAL FIXATION (ORIF) OF LEFT HUMERUS (Left)  Patient Location: PACU  Anesthesia Type:General  Level of Consciousness: awake  Airway & Oxygen Therapy: Patient Spontanous Breathing and Patient connected to nasal cannula oxygen  Post-op Assessment: Report given to RN and Post -op Vital signs reviewed and stable  Post vital signs: Reviewed and stable  Last Vitals:  Vitals Value Taken Time  BP 175/91 12/28/20 1723  Temp    Pulse 87 12/28/20 1727  Resp 16 12/28/20 1727  SpO2 100 % 12/28/20 1727  Vitals shown include unvalidated device data.  Last Pain:  Vitals:   12/28/20 1336  TempSrc: Oral  PainSc: 0-No pain      Patients Stated Pain Goal: 4 (0000000 [id number])  Complications: No notable events documented.

## 2020-12-28 NOTE — Anesthesia Procedure Notes (Signed)
Procedure Name: Intubation Date/Time: 12/28/2020 3:17 PM Performed by: Milford Cage, CRNA Pre-anesthesia Checklist: Patient identified, Emergency Drugs available, Suction available and Patient being monitored Patient Re-evaluated:Patient Re-evaluated prior to induction Oxygen Delivery Method: Circle system utilized Preoxygenation: Pre-oxygenation with 100% oxygen Induction Type: IV induction Ventilation: Mask ventilation without difficulty Laryngoscope Size: Miller and 2 Grade View: Grade I Tube type: Oral Tube size: 7.0 mm Number of attempts: 1 Airway Equipment and Method: Stylet Placement Confirmation: ETT inserted through vocal cords under direct vision, positive ETCO2 and breath sounds checked- equal and bilateral Secured at: 20 cm Tube secured with: Tape Dental Injury: Teeth and Oropharynx as per pre-operative assessment

## 2020-12-28 NOTE — Anesthesia Postprocedure Evaluation (Signed)
Anesthesia Post Note  Patient: Kendra Harper  Procedure(s) Performed: OPEN REDUCTION INTERNAL FIXATION (ORIF) OF LEFT HUMERUS (Left)     Patient location during evaluation: PACU Anesthesia Type: General Level of consciousness: awake and alert Pain management: pain level controlled Vital Signs Assessment: post-procedure vital signs reviewed and stable Respiratory status: spontaneous breathing, nonlabored ventilation, respiratory function stable and patient connected to nasal cannula oxygen Cardiovascular status: blood pressure returned to baseline and stable Postop Assessment: no apparent nausea or vomiting Anesthetic complications: no   No notable events documented.  Last Vitals:  Vitals:   12/28/20 1800 12/28/20 1811  BP: (!) 155/89 (!) 155/80  Pulse: 87 80  Resp: 19 18  Temp: 36.4 C   SpO2: 98% 99%    Last Pain:  Vitals:   12/28/20 1811  TempSrc:   PainSc: 0-No pain                 Tiajuana Amass

## 2020-12-28 NOTE — H&P (Signed)
ORTHOPAEDIC H and p  REQUESTING PHYSICIAN: Nicholes Stairs, MD  PCP:  Andree Moro, DO  Chief Complaint: Left humerus fracture  HPI: Kendra Harper is a 80 y.o. female who complains of left humeral pain and nonunion of a fracture.  We have been monitoring this for about 2 months with no real progression or signs of healing for this long oblique fracture.  We are here today for operative management.  No new complaints.  Past Medical History:  Diagnosis Date   Allergic rhinitis    Arthritis    Diabetes mellitus without complication (HCC)    Glaucoma    Osteoporosis    PAT (paroxysmal atrial tachycardia) ([place])    noted on event monitor 10/2020   Pneumonia    Protein calorie malnutrition (HCC)    PVCs (premature ventricular contractions)    Past Surgical History:  Procedure Laterality Date   ABDOMINAL HYSTERECTOMY     APPENDECTOMY     EYE SURGERY     heel surgery Bilateral    PARATHYROIDECTOMY     TONSILLECTOMY     Social History   Socioeconomic History   Marital status: Widowed    Spouse name: Not on file   Number of children: Not on file   Years of education: Not on file   Highest education level: Not on file  Occupational History   Not on file  Tobacco Use   Smoking status: Never   Smokeless tobacco: Never  Vaping Use   Vaping Use: Never used  Substance and Sexual Activity   Alcohol use: No   Drug use: No   Sexual activity: Not on file  Other Topics Concern   Not on file  Social History Narrative   Not on file   Social Determinants of Health   Financial Resource Strain: Not on file  Food Insecurity: Not on file  Transportation Needs: Not on file  Physical Activity: Not on file  Stress: Not on file  Social Connections: Not on file   Family History  Problem Relation Age of Onset   Diabetes Father    Heart failure Father    Diabetes Brother        type 1   Cancer Brother    CAD Paternal Grandmother        died of MI in 21's   CAD  Paternal Grandfather        died of MI in 66's   Allergies  Allergen Reactions   Lyrica [Pregabalin] Other (See Comments)    suicidal    Prior to Admission medications   Medication Sig Start Date End Date Taking? Authorizing Provider  insulin aspart (NOVOLOG) 100 UNIT/ML injection Inject into the skin continuous. Via pump   Yes [provider]  latanoprost (XALATAN) 0.005 % ophthalmic solution Place 1 drop into both eyes at bedtime.   Yes [provider]  Polyethyl Glycol-Propyl Glycol (SYSTANE OP) Place 1 drop into both eyes 2 (two) times daily as needed (dry eyes).   Yes [provider]   No results found.  Positive ROS: All other systems have been reviewed and were otherwise negative with the exception of those mentioned in the HPI and as above.  Physical Exam: General: Alert, no acute distress Cardiovascular: No pedal edema Respiratory: No cyanosis, no use of accessory musculature GI: No organomegaly, abdomen is soft and non-tender Skin: No lesions in the area of chief complaint Neurologic: Sensation intact distally Psychiatric: Patient is competent for consent with normal mood  and affect Lymphatic: No axillary or cervical lymphadenopathy  MUSCULOSKELETAL: Left upper extremity is warm and well-perfused with no open wounds or lesions.  Neurovascularly intact distal.  Assessment: Left humerus atrophic nonunion shaft.  Plan: -Plan to proceed today with open reduction and internal fixation.  We discussed the risk of bleeding, infection, damage to surrounding nerves and vessels, damage to surrounding nerves and vessels, nonunion, malunion, hardware failure, need for further surgery or revision surgery, as well as the risk of anesthesia.  She has provided informed consent.  -We will plan for discharge home postoperative from PACU.  We will certainly monitor her in PACU to ensure that she is safe for that plan.    Nicholes Stairs, MD Cell [phone]    12/28/2020 2:44 PM

## 2020-12-28 NOTE — Op Note (Signed)
12/28/2020  5:22 PM  PATIENT:  Kendra Harper    PRE-OPERATIVE DIAGNOSIS: Left humeral shaft nonunion   POST-OPERATIVE DIAGNOSIS:  Same  PROCEDURE:  OPEN REDUCTION INTERNAL FIXATION (ORIF) OF LEFT HUMERUS  SURGEON:  Nicholes Stairs, MD  PHYSICIAN ASSISTANT: Jonelle Sidle, PA-C present and scrubbed throughout the case, critical for completion in a timely fashion, and for retraction, instrumentation, and closure.  ANESTHESIA:   General with interscalene  PREOPERATIVE INDICATIONS:  Kendra Harper is a  80 y.o. female with a diagnosis of Left Humerus fracture who elected for surgical management.  She was initially treated with a trial of conservative care given the shaft fracture and long oblique morphology.  However, this was going on to a clear and painful atrophic nonunion based on serial radiographs and increasing pain and pathologic motion.  The risks benefits and alternatives were discussed with the patient including but not limited to the risks of nonoperative treatment, versus surgical intervention including infection, bleeding, nerve injury, malunion, nonunion, the need for revision surgery, hardware prominence, hardware failure, the need for hardware removal, blood clots, cardiopulmonary complications, conversion to arthroplasty, morbidity, mortality, among others, and they were willing to proceed.  Predicted outcome is good, although there will be at least a six to nine month expected recovery.   OPERATIVE IMPLANTS:  Synthes 3.5 mm locking plate, 12 hole with 2 3.5 mm cortical nonlocking screws and 5 locking screws 3 interfragmentary 2.7 millimeter screws  OPERATIVE FINDINGS: Atrophic nonunion with significant fibrous nonunion interposed between the 2 main fragments.  There was no comminution appreciated.  There was calcification and fibrotic tissue noted throughout the entire course of the fracture but no area of union with any bony bridging.  UNIQUE ASPECTS OF THE CASE:  Osteoporotic bone  OPERATIVE PROCEDURE: The patient was brought to the operating room and placed in the supine position. General anesthesia was administered. IV antibiotics were given.  Operative extremity was placed on a hand table. All bony prominences were padded. The upper extremity was prepped and draped in usual sterile fashion. Deltopectoral incision with an extended anterior lateral approach was performed.  Cephalic vein was encountered and mobilized and transposed medially throughout the case.  We then identified the biceps brachii muscle which was likewise mobilized and retracted medially throughout the case.  The anterior aspect of the deltoid insertion was elevated off of the humeral shaft.  We then split the brachialis in line with muscle fibers along the course of the fracture site.  I exposed the fracture site, and placed deep retractors.  At this juncture we encountered significant fibrous nonunion.  Distally there was some bulky fibrous material that was elevated with osteotome and then rongeured away.  There was external rotation and flexion of the proximal fragment and internal rotation of the distal fragment.  This allowed a significant gap to forearm which was interposed with fibrous nonunion but also with brachialis muscle.  This was likewise carefully removed from the fracture site with blunt instruments and care not to damage surrounding neurovascular structures.  We then copiously irrigated the fracture site.  We did note that bone quality was poor.  We then used intraoperative fluoroscopy to anatomically align with bone clamps the fracture fragments.  Once we were satisfied with that we then placed 3 interfragmentary screws with 2.7 mm solid and cortical screws.  These had fair purchase.  Next, we went to apply our 12 hole 3.5 mm locking LCP plate from [employer].  We placed this provisionally with Kirschner  wires.  We checked on AP and lateral fluoroscopy adequate position.  This gave  [place] good fit on the bone as well as proximal and distal fixation.  We then applied a distal cortical screw to help reduce the plate to bone.  Then proximally we did the same with 3.5 mm cortical screw.  We then filled remaining holes proximal and distal with 3 locking screws distally and 2 additional locking screws proximally.  We then took AP and lateral fluoroscopy intraoperatively to confirm reduction as well as placement of plate.  We were satisfied with this.  The wound was then copiously irrigated with normal saline.  Then applied a gram of vancomycin powder into the wound to help with perioperative infection control.  Next, we repaired the deltoid tendon back to the proximal humerus via holes in the plate.  We did this with #1 Vicryl.  Subcutaneous fat was likewise closed with 0 Vicryl, 2-0 Vicryl for deep dermis and then a running subcuticular 3-0 Monocryl for skin.  The arm was cleaned and dried 1 final time Steri-Strips were applied, as well as a occlusive Aquacel dressing.   Disposition:  Pending will be nonweightbearing to the left upper extremity but may use the hand and wrist for activities of daily living.  She can begin immediate active range of motion of the hand, elbow, and wrist.  She will be in the sling for the first 2 weeks but may remove it for shoulder range of motion up to 90 degrees.  Internal and external rotation just with activities of daily living only.  I will see her back in the office in 2 weeks with 2 x-ray views of the left humerus, AP and lateral.  She can maintain postoperative bandage until follow-up appointment with me.  She may begin showering on postoperative day #2.

## 2020-12-28 NOTE — Anesthesia Procedure Notes (Signed)
Anesthesia Regional Block: Interscalene brachial plexus block   Pre-Anesthetic Checklist: , timeout performed,  Correct Patient, Correct Site, Correct Laterality,  Correct Procedure, Correct Position, site marked,  Risks and benefits discussed,  Surgical consent,  Pre-op evaluation,  At surgeon's request and post-op pain management  Laterality: Left  Prep: chloraprep       Needles:  Injection technique: Single-shot  Needle Type: Echogenic Stimulator Needle     Needle Length: 5cm  Needle Gauge: 22     Additional Needles:   Procedures:,,,, ultrasound used (permanent image in chart),,    Narrative:  Start time: 12/28/2020 2:05 PM End time: 12/28/2020 2:10 PM Injection made incrementally with aspirations every 5 mL.  Performed by: Personally  Anesthesiologist: Merlinda Frederick, MD  Additional Notes: Functioning IV was confirmed and monitors applied.  A 54m 22ga echogenic arrow stimulator was used. Sterile prep and drape,hand hygiene and sterile gloves were used.Ultrasound guidance: relevant anatomy identified, needle position confirmed, local anesthetic spread visualized around nerve(s)., vascular puncture avoided.  Image printed for medical record.  Negative aspiration and negative test dose prior to incremental administration of local anesthetic. The patient tolerated the procedure well.

## 2020-12-28 NOTE — Brief Op Note (Signed)
12/28/2020  5:19 PM  PATIENT:  Kendra Harper  80 y.o. female  PRE-OPERATIVE DIAGNOSIS:  Left Humerus fracture nonunion  POST-OPERATIVE DIAGNOSIS:  same  PROCEDURE:  Procedure(s): OPEN REDUCTION INTERNAL FIXATION (ORIF) OF LEFT HUMERUS (Left)  SURGEON:  Surgeon(s) and Role:    * Nicholes Stairs, MD - Primary  PHYSICIAN ASSISTANT: Jonelle Sidle, PA-C  ANESTHESIA:   regional and general  EBL:  150 mL   BLOOD ADMINISTERED:none  DRAINS: none   LOCAL MEDICATIONS USED:  NONE  SPECIMEN:  No Specimen  DISPOSITION OF SPECIMEN:  N/A  COUNTS:  YES  TOURNIQUET:  * No tourniquets in log *  DICTATION: .Note written in EPIC  PLAN OF CARE: Discharge to home after PACU  PATIENT DISPOSITION:  PACU - hemodynamically stable.   Delay start of Pharmacological VTE agent (>24hrs) due to surgical blood loss or risk of bleeding: not applicable

## 2020-12-28 NOTE — Progress Notes (Signed)
BP elevated after block. Notified Dr Elgie Congo. Orders for labetalol received.

## 2020-12-29 LAB — VITAMIN D 25 HYDROXY (VIT D DEFICIENCY, FRACTURES): Vit D, 25-Hydroxy: 25.97 ng/mL — ABNORMAL LOW (ref 30–100)

## 2020-12-30 ENCOUNTER — Encounter (HOSPITAL_COMMUNITY): Payer: Self-pay | Admitting: Orthopedic Surgery

## 2021-01-25 ENCOUNTER — Encounter (HOSPITAL_COMMUNITY): Payer: Medicare Other

## 2021-01-25 ENCOUNTER — Other Ambulatory Visit (HOSPITAL_COMMUNITY): Payer: Medicare Other

## 2021-02-04 ENCOUNTER — Other Ambulatory Visit: Payer: Medicare Other

## 2021-07-12 ENCOUNTER — Ambulatory Visit
Admission: RE | Admit: 2021-07-12 | Discharge: 2021-07-12 | Disposition: A | Discharge status: Home / Self Care | Payer: Medicare HMO | Source: Ambulatory Visit | Attending: Family Medicine | Admitting: Family Medicine

## 2021-07-12 ENCOUNTER — Other Ambulatory Visit: Payer: Self-pay

## 2021-07-12 ENCOUNTER — Other Ambulatory Visit: Payer: Self-pay | Admitting: Family Medicine

## 2021-07-12 DIAGNOSIS — M79672 Pain in left foot: Secondary | ICD-10-CM

## 2021-07-12 DIAGNOSIS — M84375A Stress fracture, left foot, initial encounter for fracture: Secondary | ICD-10-CM

## 2021-07-18 ENCOUNTER — Other Ambulatory Visit: Payer: Self-pay

## 2021-07-18 ENCOUNTER — Ambulatory Visit: Payer: Medicare HMO | Admitting: Podiatry

## 2021-07-18 DIAGNOSIS — M7751 Other enthesopathy of right foot: Secondary | ICD-10-CM | POA: Diagnosis not present

## 2021-07-20 ENCOUNTER — Encounter: Payer: Self-pay | Admitting: Podiatry

## 2021-07-26 DIAGNOSIS — M7751 Other enthesopathy of right foot: Secondary | ICD-10-CM | POA: Diagnosis not present

## 2021-07-26 MED ORDER — MELOXICAM 15 MG PO TABS: 15.0000 mg | ORAL_TABLET | Freq: Every day | ORAL | 1 refills | Status: DC

## 2021-07-26 MED ORDER — BETAMETHASONE SOD PHOS & ACET 6 (3-3) MG/ML IJ SUSP
3.0000 mg | Freq: Once | INTRAMUSCULAR | Status: AC
  Administered 2021-07-26: 3 mg via INTRA_ARTICULAR

## 2021-07-26 NOTE — Progress Notes (Signed)
° °  HPI: 81 y.o. female presenting today as a new patient for evaluation of right foot pain this been going on for about 3-5 months now.  Gradual onset.  She denies a history of injury.  She says it is painful when walking and at night when the sheet touches her foot.  X-rays were taken at the PCP office.  She was referred here for further treatment and evaluation  Past Medical History:  Diagnosis Date   Allergic rhinitis    Arthritis    Diabetes mellitus without complication ([place])    Glaucoma    Osteoporosis    PAT (paroxysmal atrial tachycardia) ([place])    noted on event monitor 10/2020   Pneumonia    Protein calorie malnutrition ([place])    PVCs (premature ventricular contractions)     Past Surgical History:  Procedure Laterality Date   ABDOMINAL HYSTERECTOMY     APPENDECTOMY     EYE SURGERY     heel surgery Bilateral    ORIF HUMERUS FRACTURE Left 12/28/2020   Procedure: OPEN REDUCTION INTERNAL FIXATION (ORIF) OF LEFT HUMERUS;  Surgeon: Nicholes Stairs, MD;  Location: [facility];  Service: Orthopedics;  Laterality: Left;   PARATHYROIDECTOMY     TONSILLECTOMY      Allergies  Allergen Reactions   Lyrica [Pregabalin] Other (See Comments)    suicidal      Physical Exam: General: The patient is alert and oriented x3 in no acute distress.  Dermatology: Skin is warm, dry and supple bilateral lower extremities. Negative for open lesions or macerations.  Vascular: Palpable pedal pulses bilaterally. Capillary refill within normal limits.  Negative for any significant edema or erythema  Neurological: Light touch and protective threshold grossly intact  Musculoskeletal Exam: No pedal deformities noted.  There is pain on palpation range of motion of the third MTP of the right foot  Radiographic Exam 07/12/2021 at PCPs office:  FINDINGS: There is no evidence of fracture or dislocation. No soft tissue abnormality is noted. Flattening of the plantar arch is noted. Calcaneal spurring is  seen.    Assessment: 1.  Third MTP capsulitis right x3 months   Plan of Care:  1. Patient evaluated. X-Rays reviewed that were taken at the PCP office.  2.  Injection of 0.5 cc Celestone Soluspan injected in the third MTP right foot 3.  Prescription for meloxicam 15 mg daily 4.  Continue Brooks running shoes 5.  Recommend reducing activity to allow the foot to heal  6.  Return to clinic in 4 weeks  *Training for a 5K      Edrick Kins, DPM [facility]  Dr. Edrick Kins, DPM    [address],  [address]                Office [phone]  Fax [phone]

## 2021-08-15 ENCOUNTER — Ambulatory Visit: Payer: Medicare HMO | Admitting: Podiatry

## 2021-08-15 ENCOUNTER — Other Ambulatory Visit (HOSPITAL_COMMUNITY): Payer: Self-pay | Admitting: Family Medicine

## 2021-08-15 ENCOUNTER — Other Ambulatory Visit: Payer: Self-pay

## 2021-08-15 DIAGNOSIS — M7751 Other enthesopathy of right foot: Secondary | ICD-10-CM

## 2021-08-15 DIAGNOSIS — I10 Essential (primary) hypertension: Secondary | ICD-10-CM

## 2021-08-15 NOTE — Progress Notes (Signed)
? ?  HPI: 81 y.o. female presenting today for follow-up evaluation of third MTP capsulitis to the right foot this been going on for about 4-6 months now.  Patient states that the injection helped significantly.  She no longer has any pain or tenderness associated to the foot.  She did not take the meloxicam because of concern for possible side effects.  No new complaints at this time ? ?Past Medical History:  ?Diagnosis Date  ? Allergic rhinitis   ? Arthritis   ? Diabetes mellitus without complication ([place])   ? Glaucoma   ? Osteoporosis   ? PAT (paroxysmal atrial tachycardia) ([place])   ? noted on event monitor 10/2020  ? Pneumonia   ? Protein calorie malnutrition ([place])   ? PVCs (premature ventricular contractions)   ? ? ?Past Surgical History:  ?Procedure Laterality Date  ? ABDOMINAL HYSTERECTOMY    ? APPENDECTOMY    ? EYE SURGERY    ? heel surgery Bilateral   ? ORIF HUMERUS FRACTURE Left 12/28/2020  ? Procedure: OPEN REDUCTION INTERNAL FIXATION (ORIF) OF LEFT HUMERUS;  Surgeon: Nicholes Stairs, MD;  Location: [facility];  Service: Orthopedics;  Laterality: Left;  ? PARATHYROIDECTOMY    ? TONSILLECTOMY    ? ? ?Allergies  ?Allergen Reactions  ? Lyrica [Pregabalin] Other (See Comments)  ?  suicidal   ? ?  ?Physical Exam: ?General: The patient is alert and oriented x3 in no acute distress. ? ?Dermatology: Skin is warm, dry and supple bilateral lower extremities. Negative for open lesions or macerations. ? ?Vascular: Palpable pedal pulses bilaterally. Capillary refill within normal limits.  Negative for any significant edema or erythema ? ?Neurological: Light touch and protective threshold grossly intact ? ?Musculoskeletal Exam: No pedal deformities noted.  There is no pain on palpation range of motion of the third MTP of the right foot ? ?Radiographic Exam 07/12/2021 at PCPs office:  ?FINDINGS: ?There is no evidence of fracture or dislocation. No soft tissue ?abnormality is noted. Flattening of the plantar arch is  noted. ?Calcaneal spurring is seen.   ? ?Assessment: ?1.  Third MTP capsulitis right x3 months ? ? ?Plan of Care:  ?1. Patient evaluated.  ?2.  The patient can now return for full activity no restrictions ?3.  Recommend good supportive shoes and sneakers ?4.  Patient did not take the meloxicam.  She is okay to discontinue ?5.  Return to clinic as needed ? ?*Training for a 5K ? ?  ?  ?Edrick Kins, DPM ?[place] ? ?Dr. Edrick Kins, DPM  ?  ?[address]                                        ?[place], [place] [address]                ?Office [phone]  ?Fax [phone] ? ? ? ? ?

## 2021-08-18 ENCOUNTER — Ambulatory Visit (HOSPITAL_COMMUNITY)
Admission: RE | Admit: 2021-08-18 | Discharge: 2021-08-18 | Disposition: A | Discharge status: Home / Self Care | Payer: Medicare HMO | Source: Ambulatory Visit | Attending: Family Medicine | Admitting: Family Medicine

## 2021-08-18 DIAGNOSIS — I493 Ventricular premature depolarization: Secondary | ICD-10-CM | POA: Insufficient documentation

## 2021-08-18 DIAGNOSIS — I081 Rheumatic disorders of both mitral and tricuspid valves: Secondary | ICD-10-CM | POA: Diagnosis not present

## 2021-08-18 DIAGNOSIS — E119 Type 2 diabetes mellitus without complications: Secondary | ICD-10-CM | POA: Diagnosis not present

## 2021-08-18 DIAGNOSIS — I10 Essential (primary) hypertension: Secondary | ICD-10-CM | POA: Diagnosis not present

## 2021-08-18 LAB — ECHOCARDIOGRAM COMPLETE
Area-P 1/2: 3.77 cm2
S' Lateral: 2.3 cm

## 2021-08-30 ENCOUNTER — Telehealth: Payer: Self-pay | Admitting: Cardiology

## 2021-08-30 NOTE — Telephone Encounter (Signed)
Reply from Dr. Radford Pax: ?Actually I recommend she be rescheduled) with one of the structural heart MDs ? ? ?Reviewed with Structural Heart Nurse Navigator and scheduled with Dr. Burt Knack 09/19/21. ?

## 2021-08-30 NOTE — Telephone Encounter (Signed)
?  Per MyChart scheduling message: ? ?Patient is asking about echo results ?

## 2021-08-30 NOTE — Telephone Encounter (Signed)
Pt states Kendra Harper PCP ordered her echo and called and recommended that she follow up with cardiology as soon as possible re: a leaky valve.   ? ?Her echo is available in epic for review.  She was to have this done the last time she saw Kendra Harper but she had fallen, broke her left arm, did therapy for weeks and then ended up having surgery and was unable to lay on left side.  She just finally completed the test.  ? ?Copied from echo report: ?Tricuspid Valve: The tricuspid valve is normal in structure. 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 No evidence of tricuspid  ?stenosis. ? ? ? ?I scheduled the patient w APP on 09/29/21 and adv her I would forward to Kendra Harper and her nurse for review and sooner follow up if they feel it is necessary. She is grateful for assistance provided.  ?

## 2021-09-19 ENCOUNTER — Ambulatory Visit: Payer: Medicare HMO | Admitting: Cardiovascular Disease

## 2021-09-19 ENCOUNTER — Encounter: Payer: Self-pay | Admitting: Cardiovascular Disease

## 2021-09-19 VITALS — BP 140/78 | HR 92 | Ht 61.0 in | Wt 103.0 lb

## 2021-09-19 DIAGNOSIS — I208 Other forms of angina pectoris: Secondary | ICD-10-CM | POA: Diagnosis not present

## 2021-09-19 DIAGNOSIS — Z01810 Encounter for preprocedural cardiovascular examination: Secondary | ICD-10-CM | POA: Diagnosis not present

## 2021-09-19 DIAGNOSIS — I361 Nonrheumatic tricuspid (valve) insufficiency: Secondary | ICD-10-CM

## 2021-09-19 MED ORDER — ASPIRIN EC 81 MG PO TBEC: 81.0000 mg | DELAYED_RELEASE_TABLET | Freq: Every day | ORAL | 3 refills | Status: AC

## 2021-09-19 NOTE — H&P (View-Only) (Signed)
?Cardiology Office Note:   ? ?Date:  09/19/2021  ? ?IDNishika Harper, DOB 02/21/1941, MRN [id number] ? ?PCP:  Andree Moro, DO ?  ?[facility] Providers ?Cardiologist:  Fransico Him, MD    ? ?Referring MD: Andree Moro, DO  ? ?Chief Complaint  ?Patient presents with  ? Chest Pain  ? ? ?History of Present Illness:   ? ?Kendra Harper is a 81 y.o. female referred by Dr. Radford Pax for evaluation of tricuspid regurgitation.  The patient has a history of longstanding type 1 diabetes.  She was initially seen by Dr. Radford Pax last year for evaluation of an abnormal EKG.  At that time, a stress test and echocardiogram were ordered but the patient had a mechanical fall and sustained a spiral fracture of her left humerus.  Those studies were deferred because of her injury.  The patient recently had her echocardiogram which showed moderately severe tricuspid regurgitation and the patient is referred today to review management strategies around her valvular heart disease. ? ?The patient is here alone today.  She has previously been an avid walker, walking 5 miles after each meal.  She states this has been an important part of controlling her diabetes over time.  Over the past year, she has developed exertional chest discomfort.  She also has fatigue and exercise intolerance.  Her chest discomfort forces her to stop and rest.  She describes this as a pressure-like sensation across the chest.  Symptoms resolved within a few minutes.  She denies orthopnea, PND, or leg swelling.  No recent problems with heart palpitations.  No other complaints at this time. ? ?Past Medical History:  ?Diagnosis Date  ? Allergic rhinitis   ? Arthritis   ? Diabetes mellitus without complication ([place])   ? Glaucoma   ? Osteoporosis   ? PAT (paroxysmal atrial tachycardia) ([place])   ? noted on event monitor 10/2020  ? Pneumonia   ? Protein calorie malnutrition ([place])   ? PVCs (premature ventricular contractions)   ? ? ?Past Surgical History:  ?Procedure  Laterality Date  ? ABDOMINAL HYSTERECTOMY    ? APPENDECTOMY    ? EYE SURGERY    ? heel surgery Bilateral   ? ORIF HUMERUS FRACTURE Left 12/28/2020  ? Procedure: OPEN REDUCTION INTERNAL FIXATION (ORIF) OF LEFT HUMERUS;  Surgeon: Nicholes Stairs, MD;  Location: [facility];  Service: Orthopedics;  Laterality: Left;  ? PARATHYROIDECTOMY    ? TONSILLECTOMY    ? ? ?Current Medications: ?Current Meds  ?Medication Sig  ? alendronate (FOSAMAX) 70 MG tablet alendronate 70 mg tablet ? Take 1 tablet every week by oral route.  ? aspirin EC 81 MG tablet Take 1 tablet (81 mg total) by mouth daily. Swallow whole.  ? atorvastatin (LIPITOR) 10 MG tablet atorvastatin 10 mg tablet ? Take 1 tablet every day by oral route.  ? ergocalciferol (VITAMIN D2) 1.25 MG (50000 UT) capsule ergocalciferol (vitamin D2) 1,250 mcg (50,000 unit) capsule ? TAKE 1 CAPSULE BY MOUTH 1 TIME A WEEK FOR 6 WEEKS  ? insulin aspart (NOVOLOG) 100 UNIT/ML injection Inject into the skin continuous. Via pump  ? latanoprost (XALATAN) 0.005 % ophthalmic solution Place 1 drop into both eyes at bedtime.  ? lisinopril (ZESTRIL) 2.5 MG tablet Take 2.5 mg by mouth daily.  ? Magnesium Oxide 250 MG TABS magnesium 250 mg (as magnesium oxide) tablet ? TAKE 1 TABLET BY MOUTH TWICE DAILY  ? Polyethyl Glycol-Propyl Glycol (SYSTANE OP) Place 1 drop into both eyes 2 (  two) times daily as needed (dry eyes).  ?  ? ?Allergies:   Lyrica [pregabalin]  ? ?Social History  ? ?Socioeconomic History  ? Marital status: Widowed  ?  Spouse name: Not on file  ? Number of children: Not on file  ? Years of education: Not on file  ? Highest education level: Not on file  ?Occupational History  ? Not on file  ?Tobacco Use  ? Smoking status: Never  ? Smokeless tobacco: Never  ?Vaping Use  ? Vaping Use: Never used  ?Substance and Sexual Activity  ? Alcohol use: No  ? Drug use: No  ? Sexual activity: Not on file  ?Other Topics Concern  ? Not on file  ?Social History Narrative  ? Not on file  ? ?Social  Determinants of Health  ? ?Financial Resource Strain: Not on file  ?Food Insecurity: Not on file  ?Transportation Needs: Not on file  ?Physical Activity: Not on file  ?Stress: Not on file  ?Social Connections: Not on file  ?  ? ?Family History: ?The patient's family history includes CAD in her paternal grandfather and paternal grandmother; Cancer in her brother; Diabetes in her brother and father; Heart failure in her father. ? ?ROS:   ?Please see the history of present illness.    ?All other systems reviewed and are negative. ? ?EKGs/Labs/Other Studies Reviewed:   ? ?The following studies were reviewed today: ?Echo: ? 1. Left ventricular ejection fraction, by estimation, is 60 to 65%. The  ?left ventricle has normal function. The left ventricle has no regional  ?wall motion abnormalities. Left ventricular diastolic parameters were  ?normal.  ? 2. Right ventricular systolic function is normal. The right ventricular  ?size is normal. There is mildly elevated pulmonary artery systolic  ?pressure.  ? 3. Mild anterior leaflet prolaspe. Mild mitral valve regurgitation. No  ?evidence of mitral stenosis. There is mild prolapse of of the mitral  ?valve.  ? 4. Tricuspid valve regurgitation is moderate to severe.  ? 5. The aortic valve is tricuspid. Aortic valve regurgitation is not  ?visualized. No aortic stenosis is present.  ? 6. The inferior vena cava is normal in size with greater than 50%  ?respiratory variability, suggesting right atrial pressure of 3 mmHg.  ? ?EKG:  EKG is ordered today.  The ekg ordered today demonstrates normal sinus rhythm 92 bpm, PVCs and fusion complexes identified both isolated and consecutive beats, ST and T wave abnormality consider inferior ischemia ? ?Recent Labs: ?12/21/2020: BUN 20; Creatinine, Ser 0.59; Hemoglobin 13.7; Platelets 228; Potassium 5.2; Sodium 140  ?Recent Lipid Panel ?No results found for: CHOL, TRIG, HDL, CHOLHDL, VLDL, LDLCALC, LDLDIRECT ? ? ?Risk Assessment/Calculations:    ?  ? ?    ? ?Physical Exam:   ? ?VS:  BP 140/78   Pulse 92   Ht '5\' 1"'$  (1.549 m)   Wt 103 lb (46.7 kg)   SpO2 98%   BMI 19.46 kg/m?    ? ?Wt Readings from Last 3 Encounters:  ?09/19/21 103 lb (46.7 kg)  ?12/28/20 97 lb 12.8 oz (44.4 kg)  ?12/27/20 97 lb 12.8 oz (44.4 kg)  ?  ? ?GEN:  Well nourished, well developed in no acute distress ?HEENT: Normal ?NECK: No JVD; No carotid bruits ?LYMPHATICS: No lymphadenopathy ?CARDIAC: Irregularly irregular, no murmurs, rubs, gallops ?RESPIRATORY:  Clear to auscultation without rales, wheezing or rhonchi  ?ABDOMEN: Soft, non-tender, non-distended ?MUSCULOSKELETAL:  No edema; No deformity  ?SKIN: Warm and dry ?NEUROLOGIC:  Alert  and oriented x 3 ?PSYCHIATRIC:  Normal affect  ? ?ASSESSMENT:   ? ?1. Exertional angina (HCC)   ?2. Nonrheumatic tricuspid valve regurgitation   ?3. Pre-procedural cardiovascular examination   ? ?PLAN:   ? ?In order of problems listed above: ? ?The patient has typical symptoms of exertional angina with the presence of longstanding diabetes.  Discussed diagnostic options with her at length and a shared decision-making conversation occurs.  After review of options including stress testing, noninvasive CTA imaging, or definitive evaluation with catheter angiography, we agree that a definitive diagnosis is appropriate.  The patient has a high risk profile and has typical symptoms of angina.  I have recommended right and left heart catheterization to assess her hemodynamics in the setting of severe tricuspid regurgitation and left heart catheterization/coronary angiography to assess for coronary artery disease and angina. I have reviewed the risks, indications, and alternatives to cardiac catheterization, possible angioplasty, and stenting with the patient. Risks include but are not limited to bleeding, infection, vascular injury, stroke, myocardial infection, arrhythmia, kidney injury, radiation-related injury in the case of prolonged fluoroscopy use,  emergency cardiac surgery, and death. The patient understands the risks of serious complication is 1-2 in 1916 with diagnostic cardiac cath and 1-2% or less with angioplasty/stenting.   ?The patient's echocardiogram is

## 2021-09-19 NOTE — Patient Instructions (Signed)
Medication Instructions:  ?START Aspirin '81mg'$  daily ?*If you need a refill on your cardiac medications before your next appointment, please call your pharmacy* ? ? ?Lab Work: ?CBC, BMET TODAY ?If you have labs (blood work) drawn today and your tests are completely normal, you will receive your results only by: ?MyChart Message (if you have MyChart) OR ?A paper copy in the mail ?If you have any lab test that is abnormal or we need to change your treatment, we will call you to review the results. ? ? ?Testing/Procedures: ?R & L Heart cath ?Your physician has requested that you have a cardiac catheterization. Cardiac catheterization is used to diagnose and/or treat various heart conditions. Doctors may recommend this procedure for a number of different reasons. The most common reason is to evaluate chest pain. Chest pain can be a symptom of coronary artery disease (CAD), and cardiac catheterization can show whether plaque is narrowing or blocking your heart?s arteries. This procedure is also used to evaluate the valves, as well as measure the blood flow and oxygen levels in different parts of your heart. For further information please visit [email or web address]. Please follow instruction sheet, as given. ? ?Follow-Up: ?At [facility], you and your health needs are our priority.  As part of our continuing mission to provide you with exceptional heart care, we have created designated Provider Care Teams.  These Care Teams include your primary Cardiologist (physician) and Advanced Practice Providers (APPs -  Physician Assistants and Nurse Practitioners) who all work together to provide you with the care you need, when you need it. ? ?Your next appointment:   ?1 year(s) ? ?The format for your next appointment:   ?In Person ? ?Provider:   ?Legrand Como Cooper,MD ? ? ?Other Instructions ? ?[facility] OFF[facility] ?[address], [address] ?[place]  [address] ?Dept: [phone] ?Loc: [phone] ? ?Kendra Harper  09/19/2021 ? ?You are scheduled for a Cardiac Catheterization on Wednesday, May 10 with Dr. Sherren Mocha. ? ?1. Please arrive at the Main Entrance A at [facility]: [place], [place] [address] at 11:00 AM (This time is two hours before your procedure to ensure your preparation). Free valet parking service is available.  ? ?Special note: Every effort is made to have your procedure done on time. Please understand that emergencies sometimes delay scheduled procedures. ? ?2. Diet: Do not eat solid foods after midnight.  You may have clear liquids until 5 AM upon the day of the procedure. ? ?3. Labs: TODAY ? ?4. Medication instructions in preparation for your procedure: ? ? Contrast Allergy: No ? ?Do NOT remove insulin pump  for procedure-you will be fasting so insulin not necessary ? ? ?On the morning of your procedure, take Aspirin and any morning medicines NOT listed above.  You may use sips of water. ? ?5. Plan to go home the same day, you will only stay overnight if medically necessary. ?6. You MUST have a responsible adult to drive you home. ?7. An adult MUST be with you the first 24 hours after you arrive home. ?8. Bring a current list of your medications, and the last time and date medication taken. ?9. Bring ID and current insurance cards. ?10.Please wear clothes that are easy to get on and off and wear slip-on shoes. ? ?Thank you for allowing [place] to care for you! ?  -- [facility] ? ?  ? ?Important Information  About Sugar ? ? ? ? ?  ?

## 2021-09-19 NOTE — Progress Notes (Signed)
?Cardiology Office Note:   ? ?Date:  09/19/2021  ? ?IDTinnie Harper, DOB Oct 09, 1940, MRN [id number] ? ?PCP:  Andree Moro, DO ?  ?[facility] Providers ?Cardiologist:  Fransico Him, MD    ? ?Referring MD: Andree Moro, DO  ? ?Chief Complaint  ?Patient presents with  ? Chest Pain  ? ? ?History of Present Illness:   ? ?Kendra Harper is a 81 y.o. female referred by Dr. Radford Pax for evaluation of tricuspid regurgitation.  The patient has a history of longstanding type 1 diabetes.  She was initially seen by Dr. Radford Pax last year for evaluation of an abnormal EKG.  At that time, a stress test and echocardiogram were ordered but the patient had a mechanical fall and sustained a spiral fracture of her left humerus.  Those studies were deferred because of her injury.  The patient recently had her echocardiogram which showed moderately severe tricuspid regurgitation and the patient is referred today to review management strategies around her valvular heart disease. ? ?The patient is here alone today.  She has previously been an avid walker, walking 5 miles after each meal.  She states this has been an important part of controlling her diabetes over time.  Over the past year, she has developed exertional chest discomfort.  She also has fatigue and exercise intolerance.  Her chest discomfort forces her to stop and rest.  She describes this as a pressure-like sensation across the chest.  Symptoms resolved within a few minutes.  She denies orthopnea, PND, or leg swelling.  No recent problems with heart palpitations.  No other complaints at this time. ? ?Past Medical History:  ?Diagnosis Date  ? Allergic rhinitis   ? Arthritis   ? Diabetes mellitus without complication ([place])   ? Glaucoma   ? Osteoporosis   ? PAT (paroxysmal atrial tachycardia) ([place])   ? noted on event monitor 10/2020  ? Pneumonia   ? Protein calorie malnutrition ([place])   ? PVCs (premature ventricular contractions)   ? ? ?Past Surgical History:  ?Procedure  Laterality Date  ? ABDOMINAL HYSTERECTOMY    ? APPENDECTOMY    ? EYE SURGERY    ? heel surgery Bilateral   ? ORIF HUMERUS FRACTURE Left 12/28/2020  ? Procedure: OPEN REDUCTION INTERNAL FIXATION (ORIF) OF LEFT HUMERUS;  Surgeon: Nicholes Stairs, MD;  Location: [facility];  Service: Orthopedics;  Laterality: Left;  ? PARATHYROIDECTOMY    ? TONSILLECTOMY    ? ? ?Current Medications: ?Current Meds  ?Medication Sig  ? alendronate (FOSAMAX) 70 MG tablet alendronate 70 mg tablet ? Take 1 tablet every week by oral route.  ? aspirin EC 81 MG tablet Take 1 tablet (81 mg total) by mouth daily. Swallow whole.  ? atorvastatin (LIPITOR) 10 MG tablet atorvastatin 10 mg tablet ? Take 1 tablet every day by oral route.  ? ergocalciferol (VITAMIN D2) 1.25 MG (50000 UT) capsule ergocalciferol (vitamin D2) 1,250 mcg (50,000 unit) capsule ? TAKE 1 CAPSULE BY MOUTH 1 TIME A WEEK FOR 6 WEEKS  ? insulin aspart (NOVOLOG) 100 UNIT/ML injection Inject into the skin continuous. Via pump  ? latanoprost (XALATAN) 0.005 % ophthalmic solution Place 1 drop into both eyes at bedtime.  ? lisinopril (ZESTRIL) 2.5 MG tablet Take 2.5 mg by mouth daily.  ? Magnesium Oxide 250 MG TABS magnesium 250 mg (as magnesium oxide) tablet ? TAKE 1 TABLET BY MOUTH TWICE DAILY  ? Polyethyl Glycol-Propyl Glycol (SYSTANE OP) Place 1 drop into both eyes 2 (  two) times daily as needed (dry eyes).  ?  ? ?Allergies:   Lyrica [pregabalin]  ? ?Social History  ? ?Socioeconomic History  ? Marital status: Widowed  ?  Spouse name: Not on file  ? Number of children: Not on file  ? Years of education: Not on file  ? Highest education level: Not on file  ?Occupational History  ? Not on file  ?Tobacco Use  ? Smoking status: Never  ? Smokeless tobacco: Never  ?Vaping Use  ? Vaping Use: Never used  ?Substance and Sexual Activity  ? Alcohol use: No  ? Drug use: No  ? Sexual activity: Not on file  ?Other Topics Concern  ? Not on file  ?Social History Narrative  ? Not on file  ? ?Social  Determinants of Health  ? ?Financial Resource Strain: Not on file  ?Food Insecurity: Not on file  ?Transportation Needs: Not on file  ?Physical Activity: Not on file  ?Stress: Not on file  ?Social Connections: Not on file  ?  ? ?Family History: ?The patient's family history includes CAD in her paternal grandfather and paternal grandmother; Cancer in her brother; Diabetes in her brother and father; Heart failure in her father. ? ?ROS:   ?Please see the history of present illness.    ?All other systems reviewed and are negative. ? ?EKGs/Labs/Other Studies Reviewed:   ? ?The following studies were reviewed today: ?Echo: ? 1. Left ventricular ejection fraction, by estimation, is 60 to 65%. The  ?left ventricle has normal function. The left ventricle has no regional  ?wall motion abnormalities. Left ventricular diastolic parameters were  ?normal.  ? 2. Right ventricular systolic function is normal. The right ventricular  ?size is normal. There is mildly elevated pulmonary artery systolic  ?pressure.  ? 3. Mild anterior leaflet prolaspe. Mild mitral valve regurgitation. No  ?evidence of mitral stenosis. There is mild prolapse of of the mitral  ?valve.  ? 4. Tricuspid valve regurgitation is moderate to severe.  ? 5. The aortic valve is tricuspid. Aortic valve regurgitation is not  ?visualized. No aortic stenosis is present.  ? 6. The inferior vena cava is normal in size with greater than 50%  ?respiratory variability, suggesting right atrial pressure of 3 mmHg.  ? ?EKG:  EKG is ordered today.  The ekg ordered today demonstrates normal sinus rhythm 92 bpm, PVCs and fusion complexes identified both isolated and consecutive beats, ST and T wave abnormality consider inferior ischemia ? ?Recent Labs: ?12/21/2020: BUN 20; Creatinine, Ser 0.59; Hemoglobin 13.7; Platelets 228; Potassium 5.2; Sodium 140  ?Recent Lipid Panel ?No results found for: CHOL, TRIG, HDL, CHOLHDL, VLDL, LDLCALC, LDLDIRECT ? ? ?Risk Assessment/Calculations:    ?  ? ?    ? ?Physical Exam:   ? ?VS:  BP 140/78   Pulse 92   Ht '5\' 1"'$  (1.549 m)   Wt 103 lb (46.7 kg)   SpO2 98%   BMI 19.46 kg/m?    ? ?Wt Readings from Last 3 Encounters:  ?09/19/21 103 lb (46.7 kg)  ?12/28/20 97 lb 12.8 oz (44.4 kg)  ?12/27/20 97 lb 12.8 oz (44.4 kg)  ?  ? ?GEN:  Well nourished, well developed in no acute distress ?HEENT: Normal ?NECK: No JVD; No carotid bruits ?LYMPHATICS: No lymphadenopathy ?CARDIAC: Irregularly irregular, no murmurs, rubs, gallops ?RESPIRATORY:  Clear to auscultation without rales, wheezing or rhonchi  ?ABDOMEN: Soft, non-tender, non-distended ?MUSCULOSKELETAL:  No edema; No deformity  ?SKIN: Warm and dry ?NEUROLOGIC:  Alert  and oriented x 3 ?PSYCHIATRIC:  Normal affect  ? ?ASSESSMENT:   ? ?1. Exertional angina (HCC)   ?2. Nonrheumatic tricuspid valve regurgitation   ?3. Pre-procedural cardiovascular examination   ? ?PLAN:   ? ?In order of problems listed above: ? ?The patient has typical symptoms of exertional angina with the presence of longstanding diabetes.  Discussed diagnostic options with her at length and a shared decision-making conversation occurs.  After review of options including stress testing, noninvasive CTA imaging, or definitive evaluation with catheter angiography, we agree that a definitive diagnosis is appropriate.  The patient has a high risk profile and has typical symptoms of angina.  I have recommended right and left heart catheterization to assess her hemodynamics in the setting of severe tricuspid regurgitation and left heart catheterization/coronary angiography to assess for coronary artery disease and angina. I have reviewed the risks, indications, and alternatives to cardiac catheterization, possible angioplasty, and stenting with the patient. Risks include but are not limited to bleeding, infection, vascular injury, stroke, myocardial infection, arrhythmia, kidney injury, radiation-related injury in the case of prolonged fluoroscopy use,  emergency cardiac surgery, and death. The patient understands the risks of serious complication is 1-2 in 3094 with diagnostic cardiac cath and 1-2% or less with angioplasty/stenting.   ?The patient's echocardiogram is

## 2021-09-20 LAB — BASIC METABOLIC PANEL
BUN/Creatinine Ratio: 32 — ABNORMAL HIGH (ref 12–28)
BUN: 23 mg/dL (ref 8–27)
CO2: 25 mmol/L (ref 20–29)
Calcium: 9.6 mg/dL (ref 8.7–10.3)
Chloride: 103 mmol/L (ref 96–106)
Creatinine, Ser: 0.73 mg/dL (ref 0.57–1.00)
Glucose: 219 mg/dL — ABNORMAL HIGH (ref 70–99)
Potassium: 4.4 mmol/L (ref 3.5–5.2)
Sodium: 140 mmol/L (ref 134–144)
eGFR: 84 mL/min/{1.73_m2} (ref >59)

## 2021-09-20 LAB — CBC
Hematocrit: 39.4 % (ref 34.0–46.6)
Hemoglobin: 13.3 g/dL (ref 11.1–15.9)
MCH: 28.2 pg (ref 26.6–33.0)
MCHC: 33.8 g/dL (ref 31.5–35.7)
MCV: 84 fL (ref 79–97)
Platelets: 306 10*3/uL (ref 150–450)
RBC: 4.72 x10E6/uL (ref 3.77–5.28)
RDW: 13.1 % (ref 11.7–15.4)
WBC: 8.1 10*3/uL (ref 3.4–10.8)

## 2021-09-21 DIAGNOSIS — S52609A Unspecified fracture of lower end of unspecified ulna, initial encounter for closed fracture: Secondary | ICD-10-CM | POA: Insufficient documentation

## 2021-09-27 ENCOUNTER — Telehealth: Payer: Self-pay | Admitting: *Deleted

## 2021-09-27 NOTE — Telephone Encounter (Signed)
Cardiac Catheterization scheduled at [facility] for: Wednesday Sep 28, 2021 1 PM ?Arrival time and place: [facility] Main Entrance A at: 68 AM ? ? ?Nothing to eat  after midnight prior to cath, clear liquids until 5 AM day of procedure. ? ?Medication instructions: ?-Hold: ? Insulin pump-pt will manage/suspend prior to procedure ?-Usual morning medications can be taken with sips of water including aspirin 81 mg. ? ?Confirmed patient has responsible adult to drive home post procedure and be with patient first 24 hours after arriving home. ? ?Patient reports no new symptoms concerning for COVID-19/no exposure to COVID-19 in the past 10 days. ? ?Reviewed procedure instructions with patient.  ?

## 2021-09-28 ENCOUNTER — Other Ambulatory Visit: Payer: Self-pay

## 2021-09-28 ENCOUNTER — Encounter (HOSPITAL_COMMUNITY)
Admission: RE | Disposition: A | Discharge status: Home / Self Care | Payer: Self-pay | Source: Home / Self Care | Attending: Cardiovascular Disease

## 2021-09-28 ENCOUNTER — Ambulatory Visit (HOSPITAL_COMMUNITY)
Admission: RE | Admit: 2021-09-28 | Discharge: 2021-09-28 | Disposition: A | Discharge status: Home / Self Care | Payer: Medicare HMO | Attending: Cardiovascular Disease | Admitting: Cardiovascular Disease

## 2021-09-28 DIAGNOSIS — E109 Type 1 diabetes mellitus without complications: Secondary | ICD-10-CM | POA: Insufficient documentation

## 2021-09-28 DIAGNOSIS — I361 Nonrheumatic tricuspid (valve) insufficiency: Secondary | ICD-10-CM | POA: Diagnosis not present

## 2021-09-28 DIAGNOSIS — I25119 Atherosclerotic heart disease of native coronary artery with unspecified angina pectoris: Secondary | ICD-10-CM | POA: Diagnosis present

## 2021-09-28 DIAGNOSIS — I209 Angina pectoris, unspecified: Secondary | ICD-10-CM | POA: Diagnosis not present

## 2021-09-28 DIAGNOSIS — R079 Chest pain, unspecified: Secondary | ICD-10-CM

## 2021-09-28 HISTORY — PX: RIGHT/LEFT HEART CATH AND CORONARY ANGIOGRAPHY: CATH118266

## 2021-09-28 LAB — POCT I-STAT EG7
Acid-Base Excess: 1 mmol/L (ref 0.0–2.0)
Bicarbonate: 26 mmol/L (ref 20.0–28.0)
Calcium, Ion: 1.19 mmol/L (ref 1.15–1.40)
HCT: 39 % (ref 36.0–46.0)
Hemoglobin: 13.3 g/dL (ref 12.0–15.0)
O2 Saturation: 78 %
Potassium: 4 mmol/L (ref 3.5–5.1)
Sodium: 139 mmol/L (ref 135–145)
TCO2: 27 mmol/L (ref 22–32)
pCO2, Ven: 42.9 mmHg — ABNORMAL LOW (ref 44–60)
pH, Ven: 7.391 (ref 7.25–7.43)
pO2, Ven: 43 mmHg (ref 32–45)

## 2021-09-28 LAB — POCT I-STAT 7, (LYTES, BLD GAS, ICA,H+H)
Acid-Base Excess: 0 mmol/L (ref 0.0–2.0)
Bicarbonate: 24.5 mmol/L (ref 20.0–28.0)
Calcium, Ion: 1.16 mmol/L (ref 1.15–1.40)
HCT: 38 % (ref 36.0–46.0)
Hemoglobin: 12.9 g/dL (ref 12.0–15.0)
O2 Saturation: 99 %
Potassium: 3.9 mmol/L (ref 3.5–5.1)
Sodium: 139 mmol/L (ref 135–145)
TCO2: 26 mmol/L (ref 22–32)
pCO2 arterial: 39.4 mmHg (ref 32–48)
pH, Arterial: 7.402 (ref 7.35–7.45)
pO2, Arterial: 147 mmHg — ABNORMAL HIGH (ref 83–108)

## 2021-09-28 LAB — GLUCOSE, CAPILLARY: Glucose-Capillary: 160 mg/dL — ABNORMAL HIGH (ref 70–99)

## 2021-09-28 SURGERY — RIGHT/LEFT HEART CATH AND CORONARY ANGIOGRAPHY
Anesthesia: LOCAL

## 2021-09-28 MED ORDER — ACETAMINOPHEN 325 MG PO TABS: 650.0000 mg | ORAL_TABLET | ORAL | Status: DC | PRN

## 2021-09-28 MED ORDER — SODIUM CHLORIDE 0.9 % WEIGHT BASED INFUSION: 1.0000 mL/kg/h | INTRAVENOUS | Status: DC

## 2021-09-28 MED ORDER — LABETALOL HCL 5 MG/ML IV SOLN: 10.0000 mg | INTRAVENOUS | Status: DC | PRN

## 2021-09-28 MED ORDER — SODIUM CHLORIDE 0.9 % IV SOLN: 250.0000 mL | INTRAVENOUS | Status: DC | PRN

## 2021-09-28 MED ORDER — SODIUM CHLORIDE 0.9 % WEIGHT BASED INFUSION
3.0000 mL/kg/h | INTRAVENOUS | Status: AC
  Administered 2021-09-28: 3 mL/kg/h via INTRAVENOUS

## 2021-09-28 MED ORDER — HEPARIN (PORCINE) IN NACL 1000-0.9 UT/500ML-% IV SOLN
INTRAVENOUS | Status: DC | PRN
Start: 2021-09-28 — End: 2021-09-28
  Administered 2021-09-28 (×2): 500 mL

## 2021-09-28 MED ORDER — LIDOCAINE HCL (PF) 1 % IJ SOLN
INTRAMUSCULAR | Status: DC | PRN
  Administered 2021-09-28 (×2): 2 mL

## 2021-09-28 MED ORDER — VERAPAMIL HCL 2.5 MG/ML IV SOLN
INTRAVENOUS | Status: DC | PRN
  Administered 2021-09-28: 10 mL via INTRA_ARTERIAL

## 2021-09-28 MED ORDER — HEPARIN (PORCINE) IN NACL 1000-0.9 UT/500ML-% IV SOLN
INTRAVENOUS | Status: AC
  Filled 2021-09-28: qty 1000

## 2021-09-28 MED ORDER — MIDAZOLAM HCL 2 MG/2ML IJ SOLN
INTRAMUSCULAR | Status: AC
  Filled 2021-09-28: qty 2

## 2021-09-28 MED ORDER — SODIUM CHLORIDE 0.9% FLUSH: 3.0000 mL | INTRAVENOUS | Status: DC | PRN

## 2021-09-28 MED ORDER — VERAPAMIL HCL 2.5 MG/ML IV SOLN
INTRAVENOUS | Status: AC
  Filled 2021-09-28: qty 2

## 2021-09-28 MED ORDER — SODIUM CHLORIDE 0.9% FLUSH: 3.0000 mL | Freq: Two times a day (BID) | INTRAVENOUS | Status: DC

## 2021-09-28 MED ORDER — HEPARIN SODIUM (PORCINE) 1000 UNIT/ML IJ SOLN
INTRAMUSCULAR | Status: AC
  Filled 2021-09-28: qty 10

## 2021-09-28 MED ORDER — MIDAZOLAM HCL 2 MG/2ML IJ SOLN
INTRAMUSCULAR | Status: DC | PRN
  Administered 2021-09-28: 1 mg via INTRAVENOUS

## 2021-09-28 MED ORDER — FENTANYL CITRATE (PF) 100 MCG/2ML IJ SOLN
INTRAMUSCULAR | Status: DC | PRN
  Administered 2021-09-28: 25 ug via INTRAVENOUS

## 2021-09-28 MED ORDER — IOHEXOL 350 MG/ML SOLN
INTRAVENOUS | Status: DC | PRN
  Administered 2021-09-28: 20 mL

## 2021-09-28 MED ORDER — ONDANSETRON HCL 4 MG/2ML IJ SOLN: 4.0000 mg | Freq: Four times a day (QID) | INTRAMUSCULAR | Status: DC | PRN

## 2021-09-28 MED ORDER — HYDRALAZINE HCL 20 MG/ML IJ SOLN: 10.0000 mg | INTRAMUSCULAR | Status: DC | PRN

## 2021-09-28 MED ORDER — ASPIRIN 81 MG PO CHEW: 81.0000 mg | CHEWABLE_TABLET | ORAL | Status: DC

## 2021-09-28 MED ORDER — FENTANYL CITRATE (PF) 100 MCG/2ML IJ SOLN
INTRAMUSCULAR | Status: AC
Start: 2021-09-28 — End: ?
  Filled 2021-09-28: qty 2

## 2021-09-28 MED ORDER — LIDOCAINE HCL (PF) 1 % IJ SOLN
INTRAMUSCULAR | Status: AC
  Filled 2021-09-28: qty 30

## 2021-09-28 MED ORDER — HEPARIN SODIUM (PORCINE) 1000 UNIT/ML IJ SOLN
INTRAMUSCULAR | Status: DC | PRN
  Administered 2021-09-28: 3000 [IU] via INTRAVENOUS

## 2021-09-28 SURGICAL SUPPLY — 12 items
CATH 5FR JL3.5 JR4 ANG PIG MP (CATHETERS) ×1 IMPLANT
CATH BALLN WEDGE 5F 110CM (CATHETERS) ×1 IMPLANT
DEVICE RAD TR BAND REGULAR (VASCULAR PRODUCTS) ×1 IMPLANT
GLIDESHEATH SLEND SS 6F .021 (SHEATH) ×2 IMPLANT
GUIDEWIRE .025 260CM (WIRE) ×1 IMPLANT
GUIDEWIRE INQWIRE 1.5J.035X260 (WIRE) IMPLANT
INQWIRE 1.5J .035X260CM (WIRE) ×2
KIT HEART LEFT (KITS) ×2 IMPLANT
PACK CARDIAC CATHETERIZATION (CUSTOM PROCEDURE TRAY) ×2 IMPLANT
SHEATH GLIDE SLENDER 4/5FR (SHEATH) ×1 IMPLANT
TRANSDUCER W/STOPCOCK (MISCELLANEOUS) ×2 IMPLANT
TUBING CIL FLEX 10 FLL-RA (TUBING) ×2 IMPLANT

## 2021-09-28 NOTE — Progress Notes (Signed)
Client states blood sugar 202 per Dexcom ?

## 2021-09-28 NOTE — Progress Notes (Addendum)
Client states blood sugar too high to show up and then down to 400; client states she will manage blood sugar; Callie PA notified and per [facility] ok to let client manage blood sugar and d/c home ?

## 2021-09-28 NOTE — Interval H&P Note (Signed)
History and Physical Interval Note: ? ?09/28/2021 ?2:16 PM ? ?Veleka Pinegar  has presented today for surgery, with the diagnosis of Angina.  The various methods of treatment have been discussed with the patient and family. After consideration of risks, benefits and other options for treatment, the patient has consented to  Procedure(s): ?RIGHT/LEFT HEART CATH AND CORONARY ANGIOGRAPHY (N/A) as a surgical intervention.  The patient's history has been reviewed, patient examined, no change in status, stable for surgery.  I have reviewed the patient's chart and labs.  Questions were answered to the patient's satisfaction.   ? ? ?Sherren Mocha ? ? ?

## 2021-09-29 ENCOUNTER — Encounter (HOSPITAL_COMMUNITY): Payer: Self-pay | Admitting: Cardiovascular Disease

## 2021-09-29 ENCOUNTER — Ambulatory Visit: Payer: Medicare HMO | Admitting: Physician Assistant

## 2022-01-01 NOTE — Progress Notes (Unsigned)
Cardiology Office Note:    Date:  01/04/2022   ID:  Kendra Harper, DOB 03-Apr-1941, MRN [id number]  PCP:  Loura Pardon, MD   [facility] Providers Cardiologist:  Fransico Him, MD     Referring MD: Loura Pardon, MD   Chief Complaint: chest pain  History of Present Illness:    Kendra Harper is a very pleasant 81 y.o. female with a hx of tricuspid regurgitation and type 1 diabetes.  She was referred to our group for evaluation of an abnormal EKG and seen by Dr. Radford Pax 10/22/20. At that time, a stress test and echocardiogram were ordered but the patient had a mechanical fall and sustained a spiral fracture of her left humerus those studies were deferred because of the injury. Cardiac monitor revealed predominant sinus rhythm with average heart rate 80 bpm, frequent PACs, atrial couplets and nonsustained atrial tachycardia.  Occasional PVCs, bigeminal PVCs, wide-complex tachycardia up to 7 beats, cannot rule out NSVT versus atrial tachycardia with aberration. Echo 08/18/21 revealed normal LVEF 60 to 74%, normal diastolic parameters, mildly elevated PASP, mild anterior mitral leaflet prolapse, mild MR, moderate to severe TR. she was referred to our structural heart team and seen by Dr. Burt Knack on 12/27/20. She reported no cardiac symptoms walking 20 miles total in a day. She was cleared for repair of her humerus fracture at that time.  She returned to see Dr. Burt Knack on 09/19/2021 and reported exertional chest discomfort, fatigue, and exercise intolerance.  Her chest discomfort forcing her to stop and rest, described as a pressure-like sensation across her chest.  She underwent left and right heart catheterization on 09/28/2021 which revealed widely patent coronary arteries with mild coronary calcification and minimal irregularity, normal right heart hemodynamics with no evidence of hemodynamically significant tricuspid regurgitation, LVEDP normal.   Today, she is here for post catheterization  follow-up. She is here alone. Reports she continues to have intense chest discomfort described as heaviness. Feels like the muscles are very tense. No SOB, diaphoresis, n/v. Felt it when getting into the car today despite no precipitating factors. When it occurs while walking, she has to stop and hold onto something or slow down her pace. This will cause it to lessen and go away after a few minutes. Becomes lightheaded on occasion with pain. Pain also occurs when sitting and resting, recently occurred while singing in church. Comes on suddenly. Does not recall any change in physical health that occurred prior to pain. She denies lower extremity edema, fatigue, palpitations, melena, hematuria, hemoptysis, diaphoresis, weakness, presyncope, syncope, orthopnea, and PND. Is concerned that pain still occurs despite normal catheterization.   Past Medical History:  Diagnosis Date   Allergic rhinitis    Arthritis    Diabetes mellitus without complication (HCC)    Glaucoma    Osteoporosis    PAT (paroxysmal atrial tachycardia) ([place])    noted on event monitor 10/2020   Pneumonia    Protein calorie malnutrition ([place])    PVCs (premature ventricular contractions)     Past Surgical History:  Procedure Laterality Date   ABDOMINAL HYSTERECTOMY     APPENDECTOMY     EYE SURGERY     heel surgery Bilateral    ORIF HUMERUS FRACTURE Left 12/28/2020   Procedure: OPEN REDUCTION INTERNAL FIXATION (ORIF) OF LEFT HUMERUS;  Surgeon: Nicholes Stairs, MD;  Location: [facility];  Service: Orthopedics;  Laterality: Left;   PARATHYROIDECTOMY     RIGHT/LEFT HEART CATH AND CORONARY ANGIOGRAPHY N/A 09/28/2021   Procedure: RIGHT/LEFT  HEART CATH AND CORONARY ANGIOGRAPHY;  Surgeon: Sherren Mocha, MD;  Location: [place] [facility];  Service: Cardiovascular;  Laterality: N/A;   TONSILLECTOMY      Current Medications: Current Meds  Medication Sig   alendronate (FOSAMAX) 70 MG tablet Take 70 mg by mouth every Sunday.    aspirin EC 81 MG tablet Take 1 tablet (81 mg total) by mouth daily. Swallow whole.   atorvastatin (LIPITOR) 10 MG tablet Take 10 mg by mouth daily.   diazepam (VALIUM) 5 MG tablet Take 1 to 2 tablets prior to MRI   insulin aspart (NOVOLOG) 100 UNIT/ML injection Inject into the skin continuous. Via pump   latanoprost (XALATAN) 0.005 % ophthalmic solution Place 1 drop into both eyes at bedtime.   losartan (COZAAR) 25 MG tablet Take 25 mg by mouth daily.   Polyethyl Glycol-Propyl Glycol (SYSTANE OP) Place 1 drop into both eyes 2 (two) times daily as needed (dry eyes).     Allergies:   Lyrica [pregabalin] and Lisinopril   Social History   Socioeconomic History   Marital status: Widowed    Spouse name: Not on file   Number of children: Not on file   Years of education: Not on file   Highest education level: Not on file  Occupational History   Not on file  Tobacco Use   Smoking status: Never   Smokeless tobacco: Never  Vaping Use   Vaping Use: Never used  Substance and Sexual Activity   Alcohol use: No   Drug use: No   Sexual activity: Not on file  Other Topics Concern   Not on file  Social History Narrative   Not on file   Social Determinants of Health   Financial Resource Strain: Not on file  Food Insecurity: Not on file  Transportation Needs: Not on file  Physical Activity: Not on file  Stress: Not on file  Social Connections: Not on file     Family History: The patient's family history includes CAD in her paternal grandfather and paternal grandmother; Cancer in her brother; Diabetes in her brother and father; Heart failure in her father.  ROS:   Please see the history of present illness.    + chest pain All other systems reviewed and are negative.  Labs/Other Studies Reviewed:    The following studies were reviewed today:  [facility] 09/28/21    LV end diastolic pressure is normal.   Widely patent coronary arteries with mild coronary calcification and minimal  irregularity   Normal right heart hemodynamics (no evidence of hemodynamically significant tricuspid regurgitation)   Echo 08/18/21   1. Left ventricular ejection fraction, by estimation, is 60 to 65%. The  left ventricle has normal function. The left ventricle has no regional  wall motion abnormalities. Left ventricular diastolic parameters were  normal.   2. Right ventricular systolic function is normal. The right ventricular  size is normal. There is mildly elevated pulmonary artery systolic  pressure.   3. Mild anterior leaflet prolaspe. Mild mitral valve regurgitation. No  evidence of mitral stenosis. There is mild prolapse of of the mitral  valve.   4. Tricuspid valve regurgitation is moderate to severe.   5. The aortic valve is tricuspid. Aortic valve regurgitation is not  visualized. No aortic stenosis is present.   6. The inferior vena cava is normal in size with greater than 50%  respiratory variability, suggesting right atrial pressure of 3 mmHg.   Cardiac monitor 11/17/20  Predominant rhythm was normal  sinus rhythm with average heart rate 80bpm and ranged from 49 to 160bpm. Frequent PACs, atrial couplets and nonsutained atrial tachycardia. Occsaional PVCs, bigeminal PVCs Wide complex tachycardia up to 7 beats, cannot rule out nonsustained ventricular tachycardia vs. atrial tachycardia with aberrat  Recent Labs: 09/19/2021: BUN 23; Creatinine, Ser 0.73; Platelets 306 09/28/2021: Hemoglobin 12.9; Potassium 3.9; Sodium 139  Recent Lipid Panel No results found for: "CHOL", "TRIG", "HDL", "CHOLHDL", "VLDL", "LDLCALC", "LDLDIRECT"   Risk Assessment/Calculations:       Physical Exam:    VS:  BP (!) 108/52   Pulse 80   Ht '5\' 1"'$  (1.549 m)   Wt 102 lb (46.3 kg)   SpO2 97%   BMI 19.27 kg/m     Wt Readings from Last 3 Encounters:  01/04/22 102 lb (46.3 kg)  09/28/21 100 lb (45.4 kg)  09/19/21 103 lb (46.7 kg)     GEN:  Well nourished, well developed in no acute  distress HEENT: Normal NECK: No JVD; No carotid bruits CARDIAC: Irregular RR, no murmurs, rubs, gallops RESPIRATORY:  Clear to auscultation without rales, wheezing or rhonchi  ABDOMEN: Soft, non-tender, non-distended MUSCULOSKELETAL:  No edema; No deformity. 2+ pedal pulses, equal bilaterally SKIN: Warm and dry NEUROLOGIC:  Alert and oriented x 3 PSYCHIATRIC:  Normal affect   EKG:  EKG is ordered today.  The ekg ordered today demonstrates NSR at 64 bpm, RAD, criteria for possible left atrial enlargement, no ST/T abnormality  Diagnoses:    1. Other chest pain   2. Hyperlipidemia LDL goal <70   3. Tricuspid valve insufficiency, unspecified etiology    Assessment and Plan:     Chest pain: Widely patent coronary arteries with mild coronary calcification and minimal irregularity on [facility] 09/28/2021. She continues to have symptoms of intense chest tightness both at rest and with exertion. Do not suspect this type of discomfort from TR. Cardiac monitor was unrevealing. We will get cardiac MR for evaluation of inflammatory process. Will review with primary cardiology for additional recommendations.   Tricuspid regurgitation: Moderate to severe on echo 08/18/21. No evidence of hemodynamically significant TR on right heart cath per Dr. Burt Knack. Continues to have chest pain as noted above.  No dyspnea, palpitations, presyncope, syncope. Reports she was advised to follow-up on tricuspid regurgitation in 1 year. Will review with primary cardiologist for recommendation on repeating echo.   Hyperlipidemia: LDL 102 on 04/2021. Mild coronary artery disease on cath. Not discussed today. Would recommend repeat fasting lipid panel at next office visit. Continue atorvastatin.   Disposition: TBD upon discussion with Drs. Turner and Cooper   Medication Adjustments/Labs and Tests Ordered: Current medicines are reviewed at length with the patient today.  Concerns regarding medicines are outlined above.  Orders  Placed This Encounter  Procedures   MR CARDIAC MORPHOLOGY W WO CONTRAST   Basic Metabolic Panel (BMET)   EKG 12-Lead   Meds ordered this encounter  Medications   diazepam (VALIUM) 5 MG tablet    Sig: Take 1 to 2 tablets prior to MRI    Dispense:  2 tablet    Refill:  0    Order Specific Question:   Supervising Provider    Answer:   Thayer Headings [phone]    Patient Instructions  Medication Instructions:  Your physician recommends that you continue on your current medications as directed. Please refer to the Current Medication list given to you today. *If you need a refill on your cardiac medications before your next appointment, please  call your pharmacy*   Lab Work: BMET once your Cardiac MRI has been ordered If you have labs (blood work) drawn today and your tests are completely normal, you will receive your results only by: MyChart Message (if you have MyChart) OR A paper copy in the mail If you have any lab test that is abnormal or we need to change your treatment, we will call you to review the results.   Testing/Procedures: Cardiac MRI   Follow-Up: At [facility], you and your health needs are our priority.  As part of our continuing mission to provide you with exceptional heart care, we have created designated Provider Care Teams.  These Care Teams include your primary Cardiologist (physician) and Advanced Practice Providers (APPs -  Physician Assistants and Nurse Practitioners) who all work together to provide you with the care you need, when you need it.  We recommend signing up for the patient portal called "MyChart".  Sign up information is provided on this After Visit Summary.  MyChart is used to connect with patients for Virtual Visits (Telemedicine).  Patients are able to view lab/test results, encounter notes, upcoming appointments, etc.  Non-urgent messages can be sent to your provider as well.   To learn more about what you can do with MyChart, go to  [email or web address].    Your next appointment:   TO BE DETERMINED  The format for your next appointment:   In Person  Provider:   Fransico Him, MD     Other Instructions   Important Information About Sugar         Signed, Emmaline Life, NP  01/04/2022 1:11 PM    [place]

## 2022-01-04 ENCOUNTER — Encounter: Payer: Self-pay | Admitting: Nurse Practitioner

## 2022-01-04 ENCOUNTER — Ambulatory Visit: Payer: Medicare HMO | Admitting: Nurse Practitioner

## 2022-01-04 VITALS — BP 108/52 | HR 80 | Ht 61.0 in | Wt 102.0 lb

## 2022-01-04 DIAGNOSIS — I071 Rheumatic tricuspid insufficiency: Secondary | ICD-10-CM | POA: Diagnosis not present

## 2022-01-04 DIAGNOSIS — R0789 Other chest pain: Secondary | ICD-10-CM

## 2022-01-04 DIAGNOSIS — E785 Hyperlipidemia, unspecified: Secondary | ICD-10-CM

## 2022-01-04 MED ORDER — DIAZEPAM 5 MG PO TABS: ORAL_TABLET | ORAL | 0 refills | Status: AC

## 2022-01-04 NOTE — Patient Instructions (Signed)
Medication Instructions:  Your physician recommends that you continue on your current medications as directed. Please refer to the Current Medication list given to you today. *If you need a refill on your cardiac medications before your next appointment, please call your pharmacy*   Lab Work: BMET once your Cardiac MRI has been ordered If you have labs (blood work) drawn today and your tests are completely normal, you will receive your results only by: [place] (if you have MyChart) OR A paper copy in the mail If you have any lab test that is abnormal or we need to change your treatment, we will call you to review the results.   Testing/Procedures: Cardiac MRI   Follow-Up: At [facility], you and your health needs are our priority.  As part of our continuing mission to provide you with exceptional heart care, we have created designated Provider Care Teams.  These Care Teams include your primary Cardiologist (physician) and Advanced Practice Providers (APPs -  Physician Assistants and Nurse Practitioners) who all work together to provide you with the care you need, when you need it.  We recommend signing up for the patient portal called "MyChart".  Sign up information is provided on this After Visit Summary.  MyChart is used to connect with patients for Virtual Visits (Telemedicine).  Patients are able to view lab/test results, encounter notes, upcoming appointments, etc.  Non-urgent messages can be sent to your provider as well.   To learn more about what you can do with MyChart, go to [email or web address].    Your next appointment:   TO BE DETERMINED  The format for your next appointment:   In Person  Provider:   Fransico Him, MD     Other Instructions   Important Information About Sugar

## 2022-01-05 ENCOUNTER — Other Ambulatory Visit: Payer: Self-pay | Admitting: *Deleted

## 2022-01-05 ENCOUNTER — Telehealth: Payer: Self-pay | Admitting: Nurse Practitioner

## 2022-01-05 MED ORDER — PANTOPRAZOLE SODIUM 40 MG PO TBEC: 40.0000 mg | DELAYED_RELEASE_TABLET | Freq: Every day | ORAL | 11 refills | Status: AC

## 2022-01-05 NOTE — Telephone Encounter (Signed)
Lvm to go over recommendations from yesterdays visit.

## 2022-01-05 NOTE — Telephone Encounter (Signed)
Please call patient and tell her that we are going to have her start Protonix 40 mg daily in addition to the plan to get the cardiac MRI that we discussed yesterday. This would calm any esophageal spasm or other gastric causes that may be contributing to pain.

## 2022-01-05 NOTE — Telephone Encounter (Signed)
Pt will start Protonix one (1) tablet by mouth ( 40 mg) daily, sent in today to requested pharmacy for # 30. Pt will wait to hear about scheduling Cardiac MRI.

## 2022-01-05 NOTE — Telephone Encounter (Signed)
Patient returned call

## 2022-01-12 ENCOUNTER — Inpatient Hospital Stay (HOSPITAL_COMMUNITY): Payer: Medicare HMO

## 2022-01-12 ENCOUNTER — Emergency Department (HOSPITAL_COMMUNITY): Payer: Medicare HMO

## 2022-01-12 ENCOUNTER — Inpatient Hospital Stay (HOSPITAL_COMMUNITY): Payer: Medicare HMO | Admitting: Certified Registered Nurse Anesthetist

## 2022-01-12 ENCOUNTER — Encounter (HOSPITAL_COMMUNITY): Admission: EM | Disposition: E | Payer: Self-pay | Source: Home / Self Care

## 2022-01-12 ENCOUNTER — Inpatient Hospital Stay (HOSPITAL_COMMUNITY)
Admission: EM | Admit: 2022-01-12 | Discharge: 2022-01-20 | DRG: 957 | Disposition: E | Payer: Medicare HMO | Attending: General Surgery | Admitting: General Surgery

## 2022-01-12 DIAGNOSIS — M1990 Unspecified osteoarthritis, unspecified site: Secondary | ICD-10-CM | POA: Diagnosis present

## 2022-01-12 DIAGNOSIS — Z833 Family history of diabetes mellitus: Secondary | ICD-10-CM

## 2022-01-12 DIAGNOSIS — Z515 Encounter for palliative care: Secondary | ICD-10-CM | POA: Diagnosis not present

## 2022-01-12 DIAGNOSIS — S3729XA Other injury of bladder, initial encounter: Secondary | ICD-10-CM | POA: Diagnosis present

## 2022-01-12 DIAGNOSIS — S36428A Contusion of other part of small intestine, initial encounter: Secondary | ICD-10-CM

## 2022-01-12 DIAGNOSIS — R791 Abnormal coagulation profile: Secondary | ICD-10-CM | POA: Diagnosis not present

## 2022-01-12 DIAGNOSIS — Z20822 Contact with and (suspected) exposure to covid-19: Secondary | ICD-10-CM | POA: Diagnosis present

## 2022-01-12 DIAGNOSIS — S32810A Multiple fractures of pelvis with stable disruption of pelvic ring, initial encounter for closed fracture: Principal | ICD-10-CM | POA: Diagnosis present

## 2022-01-12 DIAGNOSIS — E11649 Type 2 diabetes mellitus with hypoglycemia without coma: Secondary | ICD-10-CM | POA: Diagnosis not present

## 2022-01-12 DIAGNOSIS — E8770 Fluid overload, unspecified: Secondary | ICD-10-CM | POA: Diagnosis present

## 2022-01-12 DIAGNOSIS — Z79899 Other long term (current) drug therapy: Secondary | ICD-10-CM

## 2022-01-12 DIAGNOSIS — Y9301 Activity, walking, marching and hiking: Secondary | ICD-10-CM | POA: Diagnosis present

## 2022-01-12 DIAGNOSIS — S42032A Displaced fracture of lateral end of left clavicle, initial encounter for closed fracture: Secondary | ICD-10-CM | POA: Diagnosis present

## 2022-01-12 DIAGNOSIS — S3720XA Unspecified injury of bladder, initial encounter: Secondary | ICD-10-CM

## 2022-01-12 DIAGNOSIS — D649 Anemia, unspecified: Secondary | ICD-10-CM

## 2022-01-12 DIAGNOSIS — N179 Acute kidney failure, unspecified: Secondary | ICD-10-CM | POA: Diagnosis not present

## 2022-01-12 DIAGNOSIS — H409 Unspecified glaucoma: Secondary | ICD-10-CM | POA: Diagnosis present

## 2022-01-12 DIAGNOSIS — S020XXA Fracture of vault of skull, initial encounter for closed fracture: Secondary | ICD-10-CM | POA: Diagnosis present

## 2022-01-12 DIAGNOSIS — R578 Other shock: Secondary | ICD-10-CM | POA: Diagnosis not present

## 2022-01-12 DIAGNOSIS — S3983XA Other specified injuries of pelvis, initial encounter: Secondary | ICD-10-CM | POA: Diagnosis not present

## 2022-01-12 DIAGNOSIS — S12000A Unspecified displaced fracture of first cervical vertebra, initial encounter for closed fracture: Secondary | ICD-10-CM | POA: Diagnosis present

## 2022-01-12 DIAGNOSIS — S36521A Contusion of transverse colon, initial encounter: Secondary | ICD-10-CM | POA: Diagnosis present

## 2022-01-12 DIAGNOSIS — S129XXA Fracture of neck, unspecified, initial encounter: Secondary | ICD-10-CM

## 2022-01-12 DIAGNOSIS — S066XAA Traumatic subarachnoid hemorrhage with loss of consciousness status unknown, initial encounter: Secondary | ICD-10-CM | POA: Diagnosis present

## 2022-01-12 DIAGNOSIS — Z66 Do not resuscitate: Secondary | ICD-10-CM | POA: Diagnosis not present

## 2022-01-12 DIAGNOSIS — E875 Hyperkalemia: Secondary | ICD-10-CM | POA: Diagnosis present

## 2022-01-12 DIAGNOSIS — N3289 Other specified disorders of bladder: Secondary | ICD-10-CM | POA: Diagnosis not present

## 2022-01-12 DIAGNOSIS — N2889 Other specified disorders of kidney and ureter: Secondary | ICD-10-CM | POA: Diagnosis present

## 2022-01-12 DIAGNOSIS — S9305XA Dislocation of left ankle joint, initial encounter: Secondary | ICD-10-CM | POA: Diagnosis present

## 2022-01-12 DIAGNOSIS — S12100A Unspecified displaced fracture of second cervical vertebra, initial encounter for closed fracture: Secondary | ICD-10-CM | POA: Diagnosis present

## 2022-01-12 DIAGNOSIS — Z794 Long term (current) use of insulin: Secondary | ICD-10-CM

## 2022-01-12 DIAGNOSIS — S3210XA Unspecified fracture of sacrum, initial encounter for closed fracture: Secondary | ICD-10-CM | POA: Diagnosis present

## 2022-01-12 DIAGNOSIS — E119 Type 2 diabetes mellitus without complications: Secondary | ICD-10-CM

## 2022-01-12 DIAGNOSIS — Z7982 Long term (current) use of aspirin: Secondary | ICD-10-CM

## 2022-01-12 DIAGNOSIS — N170 Acute kidney failure with tubular necrosis: Secondary | ICD-10-CM | POA: Diagnosis not present

## 2022-01-12 DIAGNOSIS — E892 Postprocedural hypoparathyroidism: Secondary | ICD-10-CM | POA: Diagnosis present

## 2022-01-12 DIAGNOSIS — D62 Acute posthemorrhagic anemia: Secondary | ICD-10-CM | POA: Diagnosis present

## 2022-01-12 DIAGNOSIS — S32059A Unspecified fracture of fifth lumbar vertebra, initial encounter for closed fracture: Secondary | ICD-10-CM | POA: Diagnosis present

## 2022-01-12 DIAGNOSIS — M810 Age-related osteoporosis without current pathological fracture: Secondary | ICD-10-CM | POA: Diagnosis present

## 2022-01-12 DIAGNOSIS — J309 Allergic rhinitis, unspecified: Secondary | ICD-10-CM | POA: Diagnosis present

## 2022-01-12 DIAGNOSIS — S27321A Contusion of lung, unilateral, initial encounter: Secondary | ICD-10-CM | POA: Diagnosis present

## 2022-01-12 DIAGNOSIS — S36528A Contusion of other part of colon, initial encounter: Secondary | ICD-10-CM | POA: Diagnosis present

## 2022-01-12 DIAGNOSIS — Z7983 Long term (current) use of bisphosphonates: Secondary | ICD-10-CM

## 2022-01-12 DIAGNOSIS — J690 Pneumonitis due to inhalation of food and vomit: Secondary | ICD-10-CM | POA: Diagnosis present

## 2022-01-12 DIAGNOSIS — S82852A Displaced trimalleolar fracture of left lower leg, initial encounter for closed fracture: Secondary | ICD-10-CM | POA: Diagnosis present

## 2022-01-12 DIAGNOSIS — E872 Acidosis, unspecified: Secondary | ICD-10-CM | POA: Diagnosis present

## 2022-01-12 DIAGNOSIS — R34 Anuria and oliguria: Secondary | ICD-10-CM | POA: Diagnosis not present

## 2022-01-12 DIAGNOSIS — I10 Essential (primary) hypertension: Secondary | ICD-10-CM | POA: Diagnosis not present

## 2022-01-12 DIAGNOSIS — S329XXA Fracture of unspecified parts of lumbosacral spine and pelvis, initial encounter for closed fracture: Secondary | ICD-10-CM | POA: Diagnosis present

## 2022-01-12 DIAGNOSIS — I609 Nontraumatic subarachnoid hemorrhage, unspecified: Secondary | ICD-10-CM

## 2022-01-12 DIAGNOSIS — Z90710 Acquired absence of both cervix and uterus: Secondary | ICD-10-CM

## 2022-01-12 DIAGNOSIS — Z888 Allergy status to other drugs, medicaments and biological substances status: Secondary | ICD-10-CM

## 2022-01-12 DIAGNOSIS — S32009A Unspecified fracture of unspecified lumbar vertebra, initial encounter for closed fracture: Secondary | ICD-10-CM

## 2022-01-12 HISTORY — PX: CYSTOSCOPY: SHX5120

## 2022-01-12 HISTORY — PX: BLADDER REPAIR: SHX6721

## 2022-01-12 HISTORY — PX: LAPAROTOMY: SHX154

## 2022-01-12 LAB — CBC
HCT: 38 % (ref 36.0–46.0)
HCT: 30 % — ABNORMAL LOW (ref 36.0–46.0)
HCT: 33.6 % — ABNORMAL LOW (ref 36.0–46.0)
Hemoglobin: 12.6 g/dL (ref 12.0–15.0)
Hemoglobin: 9.8 g/dL — ABNORMAL LOW (ref 12.0–15.0)
Hemoglobin: 11.1 g/dL — ABNORMAL LOW (ref 12.0–15.0)
MCH: 28.4 pg (ref 26.0–34.0)
MCH: 30.7 pg (ref 26.0–34.0)
MCH: 29.7 pg (ref 26.0–34.0)
MCHC: 33.2 g/dL (ref 30.0–36.0)
MCHC: 32.7 g/dL (ref 30.0–36.0)
MCHC: 33 g/dL (ref 30.0–36.0)
MCV: 85.6 fL (ref 80.0–100.0)
MCV: 94 fL (ref 80.0–100.0)
MCV: 89.8 fL (ref 80.0–100.0)
Platelets: 254 10*3/uL (ref 150–400)
Platelets: 90 10*3/uL — ABNORMAL LOW (ref 150–400)
Platelets: 71 10*3/uL — ABNORMAL LOW (ref 150–400)
RBC: 4.44 MIL/uL (ref 3.87–5.11)
RBC: 3.19 MIL/uL — ABNORMAL LOW (ref 3.87–5.11)
RBC: 3.74 MIL/uL — ABNORMAL LOW (ref 3.87–5.11)
RDW: 14.2 % (ref 11.5–15.5)
RDW: 14.6 % (ref 11.5–15.5)
RDW: 15.6 % — ABNORMAL HIGH (ref 11.5–15.5)
WBC: 7.6 10*3/uL (ref 4.0–10.5)
WBC: 13.6 10*3/uL — ABNORMAL HIGH (ref 4.0–10.5)
WBC: 8.1 10*3/uL (ref 4.0–10.5)
nRBC: 0 % (ref 0.0–0.2)
nRBC: 0.1 % (ref 0.0–0.2)
nRBC: 0 % (ref 0.0–0.2)

## 2022-01-12 LAB — COMPREHENSIVE METABOLIC PANEL
ALT: 157 U/L — ABNORMAL HIGH (ref 0–44)
AST: 211 U/L — ABNORMAL HIGH (ref 15–41)
Albumin: 2.8 g/dL — ABNORMAL LOW (ref 3.5–5.0)
Alkaline Phosphatase: 39 U/L (ref 38–126)
Anion gap: 14 (ref 5–15)
BUN: 23 mg/dL (ref 8–23)
CO2: 14 mmol/L — ABNORMAL LOW (ref 22–32)
Calcium: 7.5 mg/dL — ABNORMAL LOW (ref 8.9–10.3)
Chloride: 112 mmol/L — ABNORMAL HIGH (ref 98–111)
Creatinine, Ser: 1.37 mg/dL — ABNORMAL HIGH (ref 0.44–1.00)
GFR, Estimated: 39 mL/min — ABNORMAL LOW (ref >60)
Glucose, Bld: 177 mg/dL — ABNORMAL HIGH (ref 70–99)
Potassium: 3.7 mmol/L (ref 3.5–5.1)
Sodium: 140 mmol/L (ref 135–145)
Total Bilirubin: 1.2 mg/dL (ref 0.3–1.2)
Total Protein: 3.8 g/dL — ABNORMAL LOW (ref 6.5–8.1)

## 2022-01-12 LAB — I-STAT CHEM 8, ED
BUN: 30 mg/dL — ABNORMAL HIGH (ref 8–23)
Calcium, Ion: 0.97 mmol/L — ABNORMAL LOW (ref 1.15–1.40)
Chloride: 106 mmol/L (ref 98–111)
Creatinine, Ser: 0.8 mg/dL (ref 0.44–1.00)
Glucose, Bld: 189 mg/dL — ABNORMAL HIGH (ref 70–99)
HCT: 37 % (ref 36.0–46.0)
Hemoglobin: 12.6 g/dL (ref 12.0–15.0)
Potassium: 4.5 mmol/L (ref 3.5–5.1)
Sodium: 137 mmol/L (ref 135–145)
TCO2: 22 mmol/L (ref 22–32)

## 2022-01-12 LAB — URINALYSIS, ROUTINE W REFLEX MICROSCOPIC
Bilirubin Urine: NEGATIVE
Glucose, UA: 50 mg/dL — AB
Ketones, ur: NEGATIVE mg/dL
Leukocytes,Ua: NEGATIVE
Nitrite: NEGATIVE
Protein, ur: 100 mg/dL — AB
Specific Gravity, Urine: 1.029 (ref 1.005–1.030)
pH: 5 (ref 5.0–8.0)

## 2022-01-12 LAB — URINALYSIS, MICROSCOPIC (REFLEX)
Bacteria, UA: NONE SEEN
RBC / HPF: >50 RBC/hpf (ref 0–5)
WBC, UA: NONE SEEN WBC/hpf (ref 0–5)

## 2022-01-12 LAB — GLUCOSE, CAPILLARY
Glucose-Capillary: 253 mg/dL — ABNORMAL HIGH (ref 70–99)
Glucose-Capillary: 120 mg/dL — ABNORMAL HIGH (ref 70–99)
Glucose-Capillary: 155 mg/dL — ABNORMAL HIGH (ref 70–99)

## 2022-01-12 LAB — LACTIC ACID, PLASMA: Lactic Acid, Venous: 3.4 mmol/L (ref 0.5–1.9)

## 2022-01-12 LAB — PREPARE RBC (CROSSMATCH)

## 2022-01-12 LAB — RESP PANEL BY RT-PCR (FLU A&B, COVID) ARPGX2
Influenza A by PCR: NEGATIVE
Influenza B by PCR: NEGATIVE
SARS Coronavirus 2 by RT PCR: NEGATIVE

## 2022-01-12 LAB — ABO/RH: ABO/RH(D): O NEG

## 2022-01-12 LAB — PROTIME-INR
INR: 1.2 (ref 0.8–1.2)
Prothrombin Time: 14.6 seconds (ref 11.4–15.2)

## 2022-01-12 LAB — SAMPLE TO BLOOD BANK

## 2022-01-12 LAB — HEMOGLOBIN A1C
Hgb A1c MFr Bld: 5.9 % — ABNORMAL HIGH (ref 4.8–5.6)
Mean Plasma Glucose: 122.63 mg/dL

## 2022-01-12 LAB — ETHANOL: Alcohol, Ethyl (B): <10 mg/dL (ref <10)

## 2022-01-12 SURGERY — LAPAROTOMY, EXPLORATORY
Anesthesia: General | Site: Perineum

## 2022-01-12 MED ORDER — CALCIUM CHLORIDE 10 % IV SOLN
INTRAVENOUS | Status: AC
Start: 2022-01-12 — End: ?
  Filled 2022-01-12: qty 10

## 2022-01-12 MED ORDER — POVIDONE-IODINE 10 % EX SWAB: 2.0000 | Freq: Once | CUTANEOUS | Status: DC

## 2022-01-12 MED ORDER — MORPHINE SULFATE (PF) 2 MG/ML IV SOLN: 2.0000 mg | INTRAVENOUS | Status: DC | PRN

## 2022-01-12 MED ORDER — SCOPOLAMINE 1 MG/3DAYS TD PT72
MEDICATED_PATCH | TRANSDERMAL | Status: AC
  Filled 2022-01-12: qty 1

## 2022-01-12 MED ORDER — LIDOCAINE 2% (20 MG/ML) 5 ML SYRINGE
INTRAMUSCULAR | Status: DC | PRN
  Administered 2022-01-12: 40 mg via INTRAVENOUS

## 2022-01-12 MED ORDER — CHLORHEXIDINE GLUCONATE 4 % EX LIQD
60.0000 mL | Freq: Once | CUTANEOUS | Status: DC
  Filled 2022-01-12: qty 60

## 2022-01-12 MED ORDER — FENTANYL CITRATE PF 50 MCG/ML IJ SOSY: 25.0000 ug | PREFILLED_SYRINGE | Freq: Once | INTRAMUSCULAR | Status: DC

## 2022-01-12 MED ORDER — ROCURONIUM BROMIDE 10 MG/ML (PF) SYRINGE
PREFILLED_SYRINGE | INTRAVENOUS | Status: DC | PRN
  Administered 2022-01-12 (×2): 50 mg via INTRAVENOUS

## 2022-01-12 MED ORDER — ONDANSETRON HCL 4 MG/2ML IJ SOLN
INTRAMUSCULAR | Status: DC | PRN
  Administered 2022-01-12: 4 mg via INTRAVENOUS

## 2022-01-12 MED ORDER — IOHEXOL 350 MG/ML SOLN
50.0000 mL | Freq: Once | INTRAVENOUS | Status: AC | PRN
Start: 2022-01-12 — End: 2022-01-12
  Administered 2022-01-12: 50 mL

## 2022-01-12 MED ORDER — VASOPRESSIN 20 UNIT/ML IV SOLN
INTRAVENOUS | Status: AC
  Filled 2022-01-12: qty 1

## 2022-01-12 MED ORDER — TRANEXAMIC ACID-NACL 1000-0.7 MG/100ML-% IV SOLN
1000.0000 mg | INTRAVENOUS | Status: AC
  Administered 2022-01-12: 1000 mg via INTRAVENOUS

## 2022-01-12 MED ORDER — SODIUM CHLORIDE 0.9 % IV SOLN: INTRAVENOUS | Status: DC | PRN

## 2022-01-12 MED ORDER — PROPOFOL 10 MG/ML IV BOLUS
INTRAVENOUS | Status: DC | PRN
  Administered 2022-01-12: 30 mg via INTRAVENOUS

## 2022-01-12 MED ORDER — SODIUM CHLORIDE 0.9% IV SOLUTION: Freq: Once | INTRAVENOUS | Status: DC

## 2022-01-12 MED ORDER — ONDANSETRON HCL 4 MG/2ML IJ SOLN: 4.0000 mg | Freq: Four times a day (QID) | INTRAMUSCULAR | Status: DC | PRN

## 2022-01-12 MED ORDER — MORPHINE SULFATE (PF) 4 MG/ML IV SOLN: 4.0000 mg | INTRAVENOUS | Status: DC | PRN

## 2022-01-12 MED ORDER — TRANEXAMIC ACID-NACL 1000-0.7 MG/100ML-% IV SOLN
INTRAVENOUS | Status: AC
Start: 2022-01-12 — End: ?
  Filled 2022-01-12: qty 100

## 2022-01-12 MED ORDER — ONDANSETRON HCL 4 MG/2ML IJ SOLN
INTRAMUSCULAR | Status: AC
  Filled 2022-01-12: qty 2

## 2022-01-12 MED ORDER — ROCURONIUM BROMIDE 10 MG/ML (PF) SYRINGE
PREFILLED_SYRINGE | INTRAVENOUS | Status: AC
  Filled 2022-01-12: qty 10

## 2022-01-12 MED ORDER — IOHEXOL 350 MG/ML SOLN
50.0000 mL | Freq: Once | INTRAVENOUS | Status: AC | PRN
  Administered 2022-01-12: 50 mL via INTRAVENOUS

## 2022-01-12 MED ORDER — PANTOPRAZOLE SODIUM 40 MG PO TBEC
40.0000 mg | DELAYED_RELEASE_TABLET | Freq: Every day | ORAL | Status: DC
  Filled 2022-01-12: qty 1

## 2022-01-12 MED ORDER — FENTANYL BOLUS VIA INFUSION
25.0000 ug | INTRAVENOUS | Status: DC | PRN
  Administered 2022-01-14: 100 ug via INTRAVENOUS

## 2022-01-12 MED ORDER — DEXMEDETOMIDINE HCL IN NACL 400 MCG/100ML IV SOLN
INTRAVENOUS | Status: DC | PRN
  Administered 2022-01-12: .5 ug/kg/h via INTRAVENOUS

## 2022-01-12 MED ORDER — CALCIUM CHLORIDE 10 % IV SOLN
INTRAVENOUS | Status: DC | PRN
  Administered 2022-01-12 (×5): 200 mg via INTRAVENOUS

## 2022-01-12 MED ORDER — HYDROMORPHONE HCL 1 MG/ML IJ SOLN
0.5000 mg | INTRAMUSCULAR | Status: DC | PRN
  Administered 2022-01-12: 0.5 mg via INTRAVENOUS
  Filled 2022-01-12: qty 1

## 2022-01-12 MED ORDER — FENTANYL CITRATE (PF) 250 MCG/5ML IJ SOLN
INTRAMUSCULAR | Status: AC
  Filled 2022-01-12: qty 5

## 2022-01-12 MED ORDER — 0.9 % SODIUM CHLORIDE (POUR BTL) OPTIME
TOPICAL | Status: DC | PRN
  Administered 2022-01-12 (×2): 1000 mL

## 2022-01-12 MED ORDER — ALBUMIN HUMAN 5 % IV SOLN: INTRAVENOUS | Status: DC | PRN

## 2022-01-12 MED ORDER — NOREPINEPHRINE 4 MG/250ML-% IV SOLN
0.0000 ug/min | INTRAVENOUS | Status: DC
  Administered 2022-01-12 (×2): 2 ug/min via INTRAVENOUS
  Administered 2022-01-13: 6 ug/min via INTRAVENOUS
  Administered 2022-01-14: 8 ug/min via INTRAVENOUS
  Administered 2022-01-14: 6 ug/min via INTRAVENOUS
  Administered 2022-01-15: 11 ug/min via INTRAVENOUS
  Administered 2022-01-15: 10 ug/min via INTRAVENOUS
  Administered 2022-01-16: 15 ug/min via INTRAVENOUS
  Filled 2022-01-12 (×8): qty 250

## 2022-01-12 MED ORDER — POLYETHYLENE GLYCOL 3350 17 G PO PACK
17.0000 g | PACK | Freq: Every day | ORAL | Status: DC
  Administered 2022-01-13 – 2022-01-17 (×3): 17 g
  Filled 2022-01-12 (×3): qty 1

## 2022-01-12 MED ORDER — ONDANSETRON 4 MG PO TBDP: 4.0000 mg | ORAL_TABLET | Freq: Four times a day (QID) | ORAL | Status: DC | PRN

## 2022-01-12 MED ORDER — ACETAMINOPHEN 10 MG/ML IV SOLN
INTRAVENOUS | Status: AC
  Filled 2022-01-12: qty 100

## 2022-01-12 MED ORDER — PANTOPRAZOLE SODIUM 40 MG IV SOLR
40.0000 mg | Freq: Every day | INTRAVENOUS | Status: DC
  Administered 2022-01-12 – 2022-01-13 (×2): 40 mg via INTRAVENOUS
  Filled 2022-01-12 (×2): qty 10

## 2022-01-12 MED ORDER — POTASSIUM CHLORIDE IN NACL 20-0.9 MEQ/L-% IV SOLN
INTRAVENOUS | Status: DC
  Filled 2022-01-12 (×2): qty 1000

## 2022-01-12 MED ORDER — LACTATED RINGERS IV SOLN: INTRAVENOUS | Status: DC | PRN

## 2022-01-12 MED ORDER — CEFAZOLIN SODIUM-DEXTROSE 2-4 GM/100ML-% IV SOLN
2.0000 g | INTRAVENOUS | Status: AC
  Administered 2022-01-12: 2 g via INTRAVENOUS

## 2022-01-12 MED ORDER — PROPOFOL 1000 MG/100ML IV EMUL
0.0000 ug/kg/min | INTRAVENOUS | Status: DC
  Administered 2022-01-12: 5 ug/kg/min via INTRAVENOUS
  Administered 2022-01-14: 10 ug/kg/min via INTRAVENOUS
  Administered 2022-01-15: 5 ug/kg/min via INTRAVENOUS
  Administered 2022-01-15: 10 ug/kg/min via INTRAVENOUS
  Administered 2022-01-16: 5 ug/kg/min via INTRAVENOUS
  Filled 2022-01-12 (×5): qty 100

## 2022-01-12 MED ORDER — SODIUM CHLORIDE (PF) 0.9 % IJ SOLN
INTRAMUSCULAR | Status: AC
  Filled 2022-01-12: qty 30

## 2022-01-12 MED ORDER — FENTANYL 2500MCG IN NS 250ML (10MCG/ML) PREMIX INFUSION
25.0000 ug/h | INTRAVENOUS | Status: DC
  Administered 2022-01-12: 50 ug/h via INTRAVENOUS
  Administered 2022-01-14: 75 ug/h via INTRAVENOUS
  Administered 2022-01-15: 100 ug/h via INTRAVENOUS
  Administered 2022-01-17: 50 ug/h via INTRAVENOUS
  Filled 2022-01-12 (×4): qty 250

## 2022-01-12 MED ORDER — FENTANYL CITRATE PF 50 MCG/ML IJ SOSY
PREFILLED_SYRINGE | INTRAMUSCULAR | Status: AC
  Administered 2022-01-12: 50 ug
  Filled 2022-01-12: qty 1

## 2022-01-12 MED ORDER — NOREPINEPHRINE 4 MG/250ML-% IV SOLN
INTRAVENOUS | Status: AC
  Filled 2022-01-12: qty 250

## 2022-01-12 MED ORDER — MIDAZOLAM HCL 2 MG/2ML IJ SOLN
INTRAMUSCULAR | Status: AC
  Filled 2022-01-12: qty 2

## 2022-01-12 MED ORDER — FENTANYL CITRATE (PF) 250 MCG/5ML IJ SOLN
INTRAMUSCULAR | Status: DC | PRN
  Administered 2022-01-12: 25 ug via INTRAVENOUS
  Administered 2022-01-12: 50 ug via INTRAVENOUS

## 2022-01-12 MED ORDER — ACETAMINOPHEN 10 MG/ML IV SOLN
INTRAVENOUS | Status: DC | PRN
  Administered 2022-01-12: 1000 mg via INTRAVENOUS

## 2022-01-12 MED ORDER — IOHEXOL 300 MG/ML  SOLN
80.0000 mL | Freq: Once | INTRAMUSCULAR | Status: AC | PRN
Start: 2022-01-12 — End: 2022-01-12
  Administered 2022-01-12: 80 mL via INTRAVENOUS

## 2022-01-12 MED ORDER — VASOPRESSIN 20 UNIT/ML IV SOLN
INTRAVENOUS | Status: DC | PRN
  Administered 2022-01-12 (×3): 1 [IU] via INTRAVENOUS

## 2022-01-12 MED ORDER — SODIUM BICARBONATE 8.4 % IV SOLN
INTRAVENOUS | Status: DC | PRN
  Administered 2022-01-12: 50 meq via INTRAVENOUS

## 2022-01-12 MED ORDER — SUCCINYLCHOLINE CHLORIDE 200 MG/10ML IV SOSY
PREFILLED_SYRINGE | INTRAVENOUS | Status: DC | PRN
  Administered 2022-01-12: 100 mg via INTRAVENOUS

## 2022-01-12 MED ORDER — PROPOFOL 10 MG/ML IV BOLUS
INTRAVENOUS | Status: AC
  Filled 2022-01-12: qty 20

## 2022-01-12 MED ORDER — INSULIN ASPART 100 UNIT/ML IJ SOLN
0.0000 [IU] | INTRAMUSCULAR | Status: DC
  Administered 2022-01-12: 5 [IU] via SUBCUTANEOUS
  Administered 2022-01-12: 2 [IU] via SUBCUTANEOUS
  Administered 2022-01-13 (×3): 3 [IU] via SUBCUTANEOUS
  Administered 2022-01-13: 2 [IU] via SUBCUTANEOUS
  Administered 2022-01-13: 3 [IU] via SUBCUTANEOUS
  Administered 2022-01-14 (×3): 2 [IU] via SUBCUTANEOUS
  Administered 2022-01-14: 3 [IU] via SUBCUTANEOUS
  Administered 2022-01-15 – 2022-01-16 (×5): 1 [IU] via SUBCUTANEOUS

## 2022-01-12 MED ORDER — SODIUM CHLORIDE 0.9 % IV SOLN: 10.0000 mL/h | Freq: Once | INTRAVENOUS | Status: DC

## 2022-01-12 MED ORDER — LIDOCAINE 2% (20 MG/ML) 5 ML SYRINGE
INTRAMUSCULAR | Status: AC
  Filled 2022-01-12: qty 5

## 2022-01-12 MED ORDER — DOCUSATE SODIUM 50 MG/5ML PO LIQD
100.0000 mg | Freq: Two times a day (BID) | ORAL | Status: DC
  Administered 2022-01-13: 100 mg
  Filled 2022-01-12: qty 10

## 2022-01-12 MED ORDER — CHLORHEXIDINE GLUCONATE CLOTH 2 % EX PADS
6.0000 | MEDICATED_PAD | Freq: Every day | CUTANEOUS | Status: DC
  Administered 2022-01-13 – 2022-01-14 (×2): 6 via TOPICAL

## 2022-01-12 SURGICAL SUPPLY — 45 items
BAG COUNTER SPONGE SURGICOUNT (BAG) IMPLANT
BAG URINE DRAIN 2000ML AR STRL (UROLOGICAL SUPPLIES) IMPLANT
BIOPATCH RED 1 DISK 7.0 (GAUZE/BANDAGES/DRESSINGS) IMPLANT
BLADE EXTENDED COATED 6.5IN (ELECTRODE) ×2 IMPLANT
CATH FOLEY 2WAY SLVR  5CC 18FR (CATHETERS) ×2
CATH FOLEY 2WAY SLVR 5CC 18FR (CATHETERS) IMPLANT
COVER SURGICAL LIGHT HANDLE (MISCELLANEOUS) ×2 IMPLANT
DRAIN CHANNEL 15F RND FF W/TCR (WOUND CARE) IMPLANT
DRAPE LAPAROSCOPIC ABDOMINAL (DRAPES) IMPLANT
DRAPE WARM FLUID 44X44 (DRAPES) ×2 IMPLANT
DRSG TEGADERM 4X4.75 (GAUZE/BANDAGES/DRESSINGS) IMPLANT
DRSG TELFA 3X8 NADH (GAUZE/BANDAGES/DRESSINGS) ×2 IMPLANT
ELECT PAD DSPR THERM+ ADLT (MISCELLANEOUS) ×4 IMPLANT
ELECT REM PT RETURN 9FT ADLT (ELECTROSURGICAL) ×2
ELECTRODE REM PT RTRN 9FT ADLT (ELECTROSURGICAL) ×2 IMPLANT
EVACUATOR SILICONE 100CC (DRAIN) IMPLANT
GAUZE 4X4 16PLY ~~LOC~~+RFID DBL (SPONGE) ×2 IMPLANT
GAUZE SPONGE 4X4 12PLY STRL (GAUZE/BANDAGES/DRESSINGS) IMPLANT
GLOVE BIOGEL M STRL SZ7.5 (GLOVE) ×2 IMPLANT
GOWN STRL REUS W/ TWL LRG LVL3 (GOWN DISPOSABLE) ×6 IMPLANT
GOWN STRL REUS W/TWL LRG LVL3 (GOWN DISPOSABLE) ×6
KIT BASIN OR (CUSTOM PROCEDURE TRAY) ×2 IMPLANT
KIT TURNOVER KIT B (KITS) IMPLANT
NS IRRIG 1000ML POUR BTL (IV SOLUTION) ×2 IMPLANT
PACK GENERAL/GYN (CUSTOM PROCEDURE TRAY) ×2 IMPLANT
PAD DRESSING TELFA 3X8 NADH (GAUZE/BANDAGES/DRESSINGS) IMPLANT
SET CYSTO W/LG BORE CLAMP LF (SET/KITS/TRAYS/PACK) IMPLANT
SPONGE INTESTINAL PEANUT (DISPOSABLE) IMPLANT
STAPLER VISISTAT 35W (STAPLE) ×2 IMPLANT
SUT ETHILON 2 0 FS 18 (SUTURE) IMPLANT
SUT PDS AB 1 TP1 96 (SUTURE) IMPLANT
SUT SILK 2 0 (SUTURE) ×2
SUT SILK 2-0 18XBRD TIE 12 (SUTURE) ×2 IMPLANT
SUT VIC AB 2-0 SH 27 (SUTURE) ×6
SUT VIC AB 2-0 SH 27X BRD (SUTURE) IMPLANT
SUT VIC AB 3-0 SH 27 (SUTURE) ×4
SUT VIC AB 3-0 SH 27X BRD (SUTURE) IMPLANT
SUT VIC AB 4-0 SH 27 (SUTURE) ×4
SUT VIC AB 4-0 SH 27XANBCTRL (SUTURE) IMPLANT
SYR 10ML LL (SYRINGE) ×2 IMPLANT
SYR TOOMEY IRRIG 70ML (MISCELLANEOUS) ×2
SYRINGE TOOMEY IRRIG 70ML (MISCELLANEOUS) IMPLANT
TOWEL GREEN STERILE FF (TOWEL DISPOSABLE) ×4 IMPLANT
TRAY FOLEY MTR SLVR 16FR STAT (SET/KITS/TRAYS/PACK) ×2 IMPLANT
TUBE CONNECTING 12X1/4 (SUCTIONS) ×2 IMPLANT

## 2022-01-12 NOTE — Anesthesia Procedure Notes (Signed)
Arterial Line Insertion Start/End08/30/2023 5:30 PM, 01/12/2022 5:36 PM Performed by: Suzette Battiest, MD  Patient location: Pre-op. Preanesthetic checklist: patient identified, IV checked, site marked, risks and benefits discussed, surgical consent, monitors and equipment checked, pre-op evaluation, timeout performed and anesthesia consent Lidocaine 1% used for infiltration Left, radial was placed Catheter size: 20 G Hand hygiene performed  and maximum sterile barriers used   Attempts: 3 Procedure performed without using ultrasound guided technique. Following insertion, dressing applied and Biopatch. Post procedure assessment: normal and unchanged  Post procedure complications: second provider assisted and unsuccessful attempts. Patient tolerated the procedure well with no immediate complications.

## 2022-01-12 NOTE — Progress Notes (Signed)
Orthopedic Tech Progress Note Patient Details:  Kendra Harper [birth year] [id number]  Ortho Devices Type of Ortho Device: Short leg splint, Stirrup splint Ortho Device/Splint Location: LLE Ortho Device/Splint Interventions: Ordered, Application, Adjustment   Post Interventions Patient Tolerated: Well Instructions Provided: Care of device  Assisted with holding the leg while other orthopedic technician splinted.  Arville Go 12/29/2021, 11:54 AM
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
## 2022-01-12 NOTE — ED Notes (Signed)
[facility] used to give I unit emergency released blood. Provider at bedside when her pressure was noted to be low.

## 2022-01-12 NOTE — ED Notes (Signed)
Patient transported to [facility] with Valetta Fuller, TRN.
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
## 2022-01-12 NOTE — Op Note (Addendum)
  01/16/2022  7:28 PM  PATIENT:  Kendra Harper  81 y.o. female  PRE-OPERATIVE DIAGNOSIS:  Bladder Rupture  POST-OPERATIVE DIAGNOSIS:  Bladder Rupture, contusions of the transverse colon and cecum  PROCEDURE:  Procedure(s): EXPLORATORY LAPAROTOMY  SURGEON:  Georganna Skeans, MD  ASSISTANTS: Rexene Alberts, MD   ANESTHESIA:   general  EBL:  Total I/O In: 1000 [I.V.:1000] Out: -   DRAINS:  foley and JP drain - see Dr. Virgina Norfolk note    SPECIMEN:  No Specimen  DISPOSITION OF SPECIMEN:  N/A  COUNTS:  YES  DICTATION: .Dragon Dictation Procedure in repair: Patient is currently in the operating room with Dr. Abner Greenspan undergoing bladder repair.  He noted some bleeding around the extraperitoneal portion of the pelvis and asked me for an intraoperative consult.  On my arrival, he had identified the extraperitoneal bladder injury.  The bleeding and the other spaces were in the pelvis seems to have quieted down.  He completed the extraperitoneal bladder repair and then I elected to do exploratory laparotomy in order to visualize the intraperitoneal portion of the bladder as well as running the bowel and make sure there are no other significant injuries.  I opened the peritoneum and there was no significant hemoperitoneum present.  I Then lysed some adhesions of the omentum down into the pelvis from the patient's previous hysterectomy.  This allowed better exploration of the abdomen.  The transverse colon had some contusions along the wall of it but it was completely viable and there was no perforation.  Similarly, there was contusion along the wall of the cecum.  This was also completely viable without perforation.  The remainder of the right colon, left colon and sigmoid colon looked okay.  The small bowel was run from the terminal ileum back to the ligament of Treitz and had no injuries.  The stomach appeared normal.  Anesthesia placed an orogastric tube and I confirmed dislocation.  Bowel was returned to  anatomic position.  The bladder was filled with irrigation and there did not seem to be any intraperitoneal bladder leaks or injury.  Please refer to Dr. Virgina Norfolk note.  This completed my portion of procedure and the patient remained in the operating room with Dr. Abner Greenspan.  There were no complications. PATIENT DISPOSITION:  PACU - hemodynamically stable.   Delay start of Pharmacological VTE agent (>24hrs) due to surgical blood loss or risk of bleeding:  yes  Georganna Skeans, MD, MPH, FACS Pager: [phone]  8/24/20237:28 PM

## 2022-01-12 NOTE — ED Notes (Addendum)
Pt transported back to CT with Trauma RN

## 2022-01-12 NOTE — Anesthesia Procedure Notes (Signed)
Procedure Name: Intubation Date/Time: 12/26/2021 5:20 PM  Performed by: Maude Leriche, CRNAPre-anesthesia Checklist: Patient identified, Emergency Drugs available, Suction available and Patient being monitored Patient Re-evaluated:Patient Re-evaluated prior to induction Oxygen Delivery Method: Circle system utilized Preoxygenation: Pre-oxygenation with 100% oxygen Induction Type: IV induction and Rapid sequence Ventilation: Mask ventilation without difficulty Laryngoscope Size: Miller and 2 Grade View: Grade I Tube type: Oral Tube size: 7.5 mm Number of attempts: 1 Airway Equipment and Method: Stylet and Video-laryngoscopy Placement Confirmation: ETT inserted through vocal cords under direct vision, positive ETCO2 and breath sounds checked- equal and bilateral Secured at: 21 cm Tube secured with: Tape Dental Injury: Teeth and Oropharynx as per pre-operative assessment  Difficulty Due To: Difficult Airway- due to cervical collar Comments: C-spine stabilization
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
## 2022-01-12 NOTE — Procedures (Signed)
Procedure: Left ankle closed reduction   Indication: Left ankle fracture   Surgeon: Silvestre Gunner, PA-C   Assist: None   Anesthesia: None   EBL: None   Complications: None   Findings: Pt did not c/o pain or tenderness at the ankle. Therefore I made the decision to attempt closed reduction without sedation. The left akle was reduced easily and the pt tolerated the procedure well.       Lisette Abu, PA-C Orthopedic Surgery [phone]

## 2022-01-12 NOTE — Transfer of Care (Signed)
Immediate Anesthesia Transfer of Care Note  Patient: Kendra Harper  Procedure(s) Performed: EXPLORATORY LAPAROTOMY (Abdomen) CYSTOSCOPY FLEXIBLE (Perineum) BLADDER REPAIR (Abdomen)  Patient Location: PACU and ICU  Anesthesia Type:General  Level of Consciousness: sedated and Patient remains intubated per anesthesia plan  Airway & Oxygen Therapy: Patient remains intubated per anesthesia plan and Patient placed on Ventilator (see vital sign flow sheet for setting)  Post-op Assessment: Report given to RN and Post -op Vital signs reviewed and stable  Post vital signs: Reviewed and stable  Last Vitals:  Vitals Value Taken Time  BP 109/71 01/03/2022 2050  Temp    Pulse 99 01/10/2022 2058  Resp 18 01/02/2022 2059  SpO2 96 % 12/29/2021 2058  Vitals shown include unvalidated device data.  Last Pain:  Vitals:   01/14/2022 1640  TempSrc: Bladder  PainSc:          Complications: No notable events documented.
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
## 2022-01-13 ENCOUNTER — Encounter (HOSPITAL_COMMUNITY): Payer: Self-pay | Admitting: General Surgery

## 2022-01-13 ENCOUNTER — Other Ambulatory Visit: Payer: Self-pay

## 2022-01-13 ENCOUNTER — Inpatient Hospital Stay (HOSPITAL_COMMUNITY): Payer: Medicare HMO

## 2022-01-13 ENCOUNTER — Inpatient Hospital Stay (HOSPITAL_COMMUNITY): Payer: Medicare HMO | Admitting: Certified Registered"

## 2022-01-13 ENCOUNTER — Encounter (HOSPITAL_COMMUNITY): Admission: EM | Disposition: E | Payer: Self-pay | Source: Home / Self Care

## 2022-01-13 DIAGNOSIS — S12100A Unspecified displaced fracture of second cervical vertebra, initial encounter for closed fracture: Secondary | ICD-10-CM

## 2022-01-13 DIAGNOSIS — E119 Type 2 diabetes mellitus without complications: Secondary | ICD-10-CM | POA: Diagnosis not present

## 2022-01-13 DIAGNOSIS — I10 Essential (primary) hypertension: Secondary | ICD-10-CM | POA: Diagnosis not present

## 2022-01-13 DIAGNOSIS — S3983XA Other specified injuries of pelvis, initial encounter: Secondary | ICD-10-CM

## 2022-01-13 DIAGNOSIS — S12000A Unspecified displaced fracture of first cervical vertebra, initial encounter for closed fracture: Secondary | ICD-10-CM

## 2022-01-13 DIAGNOSIS — S82852A Displaced trimalleolar fracture of left lower leg, initial encounter for closed fracture: Secondary | ICD-10-CM

## 2022-01-13 DIAGNOSIS — I609 Nontraumatic subarachnoid hemorrhage, unspecified: Secondary | ICD-10-CM

## 2022-01-13 HISTORY — PX: SACRO-ILIAC PINNING: SHX5050

## 2022-01-13 HISTORY — PX: ORIF ANKLE FRACTURE: SHX5408

## 2022-01-13 LAB — POCT I-STAT 7, (LYTES, BLD GAS, ICA,H+H)
Acid-base deficit: 13 mmol/L — ABNORMAL HIGH (ref 0.0–2.0)
Acid-base deficit: 18 mmol/L — ABNORMAL HIGH (ref 0.0–2.0)
Acid-base deficit: 8 mmol/L — ABNORMAL HIGH (ref 0.0–2.0)
Bicarbonate: 12.6 mmol/L — ABNORMAL LOW (ref 20.0–28.0)
Bicarbonate: 14.8 mmol/L — ABNORMAL LOW (ref 20.0–28.0)
Bicarbonate: 17.5 mmol/L — ABNORMAL LOW (ref 20.0–28.0)
Calcium, Ion: 0.97 mmol/L — ABNORMAL LOW (ref 1.15–1.40)
Calcium, Ion: 1.01 mmol/L — ABNORMAL LOW (ref 1.15–1.40)
Calcium, Ion: 1.08 mmol/L — ABNORMAL LOW (ref 1.15–1.40)
HCT: 22 % — ABNORMAL LOW (ref 36.0–46.0)
HCT: 25 % — ABNORMAL LOW (ref 36.0–46.0)
HCT: 27 % — ABNORMAL LOW (ref 36.0–46.0)
Hemoglobin: 7.5 g/dL — ABNORMAL LOW (ref 12.0–15.0)
Hemoglobin: 8.5 g/dL — ABNORMAL LOW (ref 12.0–15.0)
Hemoglobin: 9.2 g/dL — ABNORMAL LOW (ref 12.0–15.0)
O2 Saturation: 100 %
O2 Saturation: 100 %
O2 Saturation: 100 %
Patient temperature: 34.6
Patient temperature: 95.7
Potassium: 3.9 mmol/L (ref 3.5–5.1)
Potassium: 3.9 mmol/L (ref 3.5–5.1)
Potassium: 4.3 mmol/L (ref 3.5–5.1)
Sodium: 141 mmol/L (ref 135–145)
Sodium: 141 mmol/L (ref 135–145)
Sodium: 142 mmol/L (ref 135–145)
TCO2: 14 mmol/L — ABNORMAL LOW (ref 22–32)
TCO2: 16 mmol/L — ABNORMAL LOW (ref 22–32)
TCO2: 19 mmol/L — ABNORMAL LOW (ref 22–32)
pCO2 arterial: 34.2 mmHg (ref 32–48)
pCO2 arterial: 43.8 mmHg (ref 32–48)
pCO2 arterial: 50.3 mmHg — ABNORMAL HIGH (ref 32–48)
pH, Arterial: 6.988 — CL (ref 7.35–7.45)
pH, Arterial: 7.137 — CL (ref 7.35–7.45)
pH, Arterial: 7.309 — ABNORMAL LOW (ref 7.35–7.45)
pO2, Arterial: 214 mmHg — ABNORMAL HIGH (ref 83–108)
pO2, Arterial: 518 mmHg — ABNORMAL HIGH (ref 83–108)
pO2, Arterial: 548 mmHg — ABNORMAL HIGH (ref 83–108)

## 2022-01-13 LAB — CBC
HCT: 31.4 % — ABNORMAL LOW (ref 36.0–46.0)
HCT: 18 % — ABNORMAL LOW (ref 36.0–46.0)
Hemoglobin: 10.7 g/dL — ABNORMAL LOW (ref 12.0–15.0)
Hemoglobin: 6 g/dL — CL (ref 12.0–15.0)
MCH: 29.7 pg (ref 26.0–34.0)
MCH: 30.2 pg (ref 26.0–34.0)
MCHC: 34.1 g/dL (ref 30.0–36.0)
MCHC: 33.3 g/dL (ref 30.0–36.0)
MCV: 87.2 fL (ref 80.0–100.0)
MCV: 90.5 fL (ref 80.0–100.0)
Platelets: 89 10*3/uL — ABNORMAL LOW (ref 150–400)
Platelets: 44 10*3/uL — ABNORMAL LOW (ref 150–400)
RBC: 3.6 MIL/uL — ABNORMAL LOW (ref 3.87–5.11)
RBC: 1.99 MIL/uL — ABNORMAL LOW (ref 3.87–5.11)
RDW: 16 % — ABNORMAL HIGH (ref 11.5–15.5)
RDW: 17.1 % — ABNORMAL HIGH (ref 11.5–15.5)
WBC: 12.4 10*3/uL — ABNORMAL HIGH (ref 4.0–10.5)
WBC: 7.9 10*3/uL (ref 4.0–10.5)
nRBC: 0 % (ref 0.0–0.2)
nRBC: 0 % (ref 0.0–0.2)

## 2022-01-13 LAB — TYPE AND SCREEN
ABO/RH(D): O NEG
Antibody Screen: NEGATIVE
Unit division: 0
Unit division: 0
Unit division: 0
Unit division: 0
Unit division: 0
Unit division: 0

## 2022-01-13 LAB — BPAM RBC
Blood Product Expiration Date: 202308312359
Blood Product Expiration Date: 202309202359
Blood Product Expiration Date: 202309212359
Blood Product Expiration Date: 202309232359
Blood Product Expiration Date: 202309252359
Blood Product Expiration Date: 202309262359
ISSUE DATE / TIME: 202308241154
ISSUE DATE / TIME: 202308241236
ISSUE DATE / TIME: 202308241350
ISSUE DATE / TIME: 202308241620
ISSUE DATE / TIME: 202308241730
ISSUE DATE / TIME: 202308241730
Unit Type and Rh: 5100
Unit Type and Rh: 5100
Unit Type and Rh: 5100
Unit Type and Rh: 9500
Unit Type and Rh: 9500
Unit Type and Rh: 9500

## 2022-01-13 LAB — PREPARE FRESH FROZEN PLASMA
Unit division: 0
Unit division: 0
Unit division: 0

## 2022-01-13 LAB — BPAM FFP
Blood Product Expiration Date: 202308262359
Blood Product Expiration Date: 202309012359
Blood Product Expiration Date: 202309072359
ISSUE DATE / TIME: 202308241237
ISSUE DATE / TIME: 202308241237
ISSUE DATE / TIME: 202308241350
Unit Type and Rh: 6200
Unit Type and Rh: 6200
Unit Type and Rh: 6200

## 2022-01-13 LAB — PREPARE CRYOPRECIPITATE: Unit division: 0

## 2022-01-13 LAB — BASIC METABOLIC PANEL
Anion gap: 12 (ref 5–15)
BUN: 27 mg/dL — ABNORMAL HIGH (ref 8–23)
CO2: 15 mmol/L — ABNORMAL LOW (ref 22–32)
Calcium: 7.3 mg/dL — ABNORMAL LOW (ref 8.9–10.3)
Chloride: 113 mmol/L — ABNORMAL HIGH (ref 98–111)
Creatinine, Ser: 1.53 mg/dL — ABNORMAL HIGH (ref 0.44–1.00)
GFR, Estimated: 34 mL/min — ABNORMAL LOW (ref >60)
Glucose, Bld: 232 mg/dL — ABNORMAL HIGH (ref 70–99)
Potassium: 4.7 mmol/L (ref 3.5–5.1)
Sodium: 140 mmol/L (ref 135–145)

## 2022-01-13 LAB — PREPARE PLATELET PHERESIS: Unit division: 0

## 2022-01-13 LAB — GLUCOSE, CAPILLARY
Glucose-Capillary: 211 mg/dL — ABNORMAL HIGH (ref 70–99)
Glucose-Capillary: 230 mg/dL — ABNORMAL HIGH (ref 70–99)
Glucose-Capillary: 216 mg/dL — ABNORMAL HIGH (ref 70–99)
Glucose-Capillary: 192 mg/dL — ABNORMAL HIGH (ref 70–99)
Glucose-Capillary: 205 mg/dL — ABNORMAL HIGH (ref 70–99)

## 2022-01-13 LAB — TRAUMA TEG PANEL
CFF Max Amplitude: <4 mm — ABNORMAL LOW (ref 15–32)
Citrated Kaolin (R): 5.8 min (ref 4.6–9.1)
Citrated Rapid TEG (MA): 40.1 mm — ABNORMAL LOW (ref 52–70)
Lysis at 30 Minutes: 0 % (ref 0.0–2.6)

## 2022-01-13 LAB — PREPARE RBC (CROSSMATCH)

## 2022-01-13 LAB — BLOOD PRODUCT ORDER (VERBAL) VERIFICATION

## 2022-01-13 LAB — BPAM CRYOPRECIPITATE
Blood Product Expiration Date: 202308241934
ISSUE DATE / TIME: 202308241603
Unit Type and Rh: 5100

## 2022-01-13 LAB — BPAM PLATELET PHERESIS
Blood Product Expiration Date: 202308272359
ISSUE DATE / TIME: 202308241603
Unit Type and Rh: 6200

## 2022-01-13 LAB — PHOSPHORUS: Phosphorus: 7.3 mg/dL — ABNORMAL HIGH (ref 2.5–4.6)

## 2022-01-13 LAB — LACTIC ACID, PLASMA: Lactic Acid, Venous: 6.9 mmol/L (ref 0.5–1.9)

## 2022-01-13 LAB — TRIGLYCERIDES: Triglycerides: 47 mg/dL (ref <150)

## 2022-01-13 LAB — MAGNESIUM: Magnesium: 1.9 mg/dL (ref 1.7–2.4)

## 2022-01-13 SURGERY — PINNING, SACROILIAC JOINT, PERCUTANEOUS
Anesthesia: General | Site: Pelvis

## 2022-01-13 MED ORDER — DOCUSATE SODIUM 100 MG PO CAPS: 100.0000 mg | ORAL_CAPSULE | Freq: Two times a day (BID) | ORAL | Status: DC

## 2022-01-13 MED ORDER — LACTATED RINGERS IV BOLUS
1000.0000 mL | Freq: Once | INTRAVENOUS | Status: AC
Start: 2022-01-13 — End: 2022-01-13
  Administered 2022-01-13: 1000 mL via INTRAVENOUS

## 2022-01-13 MED ORDER — METHOCARBAMOL 500 MG PO TABS
1000.0000 mg | ORAL_TABLET | Freq: Three times a day (TID) | ORAL | Status: DC
  Administered 2022-01-13 – 2022-01-17 (×6): 1000 mg
  Filled 2022-01-13 (×6): qty 2

## 2022-01-13 MED ORDER — 0.9 % SODIUM CHLORIDE (POUR BTL) OPTIME
TOPICAL | Status: DC | PRN
  Administered 2022-01-13: 1000 mL

## 2022-01-13 MED ORDER — PROSOURCE TF20 ENFIT COMPATIBL EN LIQD
60.0000 mL | Freq: Every day | ENTERAL | Status: DC
  Administered 2022-01-13 – 2022-01-16 (×2): 60 mL
  Filled 2022-01-13 (×3): qty 60

## 2022-01-13 MED ORDER — VANCOMYCIN HCL 1000 MG IV SOLR
INTRAVENOUS | Status: AC
  Filled 2022-01-13: qty 20

## 2022-01-13 MED ORDER — PROPOFOL 10 MG/ML IV BOLUS
INTRAVENOUS | Status: AC
  Filled 2022-01-13: qty 20

## 2022-01-13 MED ORDER — FENTANYL CITRATE (PF) 100 MCG/2ML IJ SOLN
INTRAMUSCULAR | Status: DC | PRN
Start: 2022-01-13 — End: 2022-01-13
  Administered 2022-01-13: 100 ug via INTRAVENOUS
  Administered 2022-01-13: 50 ug via INTRAVENOUS

## 2022-01-13 MED ORDER — METOCLOPRAMIDE HCL 5 MG/ML IJ SOLN: 5.0000 mg | Freq: Three times a day (TID) | INTRAMUSCULAR | Status: DC | PRN

## 2022-01-13 MED ORDER — METHOCARBAMOL 500 MG PO TABS: 500.0000 mg | ORAL_TABLET | Freq: Four times a day (QID) | ORAL | Status: DC | PRN

## 2022-01-13 MED ORDER — BACITRACIN ZINC 500 UNIT/GM EX OINT
TOPICAL_OINTMENT | CUTANEOUS | Status: AC
  Filled 2022-01-13: qty 28.35

## 2022-01-13 MED ORDER — ROCURONIUM BROMIDE 10 MG/ML (PF) SYRINGE
PREFILLED_SYRINGE | INTRAVENOUS | Status: DC | PRN
  Administered 2022-01-13: 50 mg via INTRAVENOUS

## 2022-01-13 MED ORDER — LACTATED RINGERS IV SOLN: INTRAVENOUS | Status: DC | PRN

## 2022-01-13 MED ORDER — FENTANYL CITRATE (PF) 250 MCG/5ML IJ SOLN
INTRAMUSCULAR | Status: AC
  Filled 2022-01-13: qty 5

## 2022-01-13 MED ORDER — LACTATED RINGERS IV BOLUS
1000.0000 mL | Freq: Once | INTRAVENOUS | Status: AC
  Administered 2022-01-13: 1000 mL via INTRAVENOUS

## 2022-01-13 MED ORDER — METOCLOPRAMIDE HCL 5 MG PO TABS: 5.0000 mg | ORAL_TABLET | Freq: Three times a day (TID) | ORAL | Status: DC | PRN

## 2022-01-13 MED ORDER — ORAL CARE MOUTH RINSE
15.0000 mL | OROMUCOSAL | Status: DC
  Administered 2022-01-13 – 2022-01-17 (×51): 15 mL via OROMUCOSAL

## 2022-01-13 MED ORDER — OXYCODONE HCL 5 MG/5ML PO SOLN
5.0000 mg | ORAL | Status: DC | PRN
  Administered 2022-01-13: 5 mg
  Filled 2022-01-13: qty 5

## 2022-01-13 MED ORDER — MIDAZOLAM HCL 2 MG/2ML IJ SOLN
INTRAMUSCULAR | Status: AC
  Filled 2022-01-13: qty 2

## 2022-01-13 MED ORDER — ACETAMINOPHEN 500 MG PO TABS
1000.0000 mg | ORAL_TABLET | Freq: Four times a day (QID) | ORAL | Status: DC
  Administered 2022-01-13 – 2022-01-17 (×8): 1000 mg
  Filled 2022-01-13 (×8): qty 2

## 2022-01-13 MED ORDER — TRANEXAMIC ACID-NACL 1000-0.7 MG/100ML-% IV SOLN
INTRAVENOUS | Status: DC | PRN
  Administered 2022-01-13: 1000 mg via INTRAVENOUS

## 2022-01-13 MED ORDER — CEFAZOLIN SODIUM-DEXTROSE 2-3 GM-%(50ML) IV SOLR
INTRAVENOUS | Status: DC | PRN
  Administered 2022-01-13: 2 g via INTRAVENOUS

## 2022-01-13 MED ORDER — LACTATED RINGERS IV SOLN: INTRAVENOUS | Status: DC

## 2022-01-13 MED ORDER — CEFAZOLIN SODIUM 1 G IJ SOLR
INTRAMUSCULAR | Status: AC
  Filled 2022-01-13: qty 20

## 2022-01-13 MED ORDER — PANTOPRAZOLE 2 MG/ML SUSPENSION
40.0000 mg | Freq: Every day | ORAL | Status: DC
  Administered 2022-01-14: 40 mg
  Filled 2022-01-13: qty 20

## 2022-01-13 MED ORDER — LACTATED RINGERS IV BOLUS
2000.0000 mL | Freq: Once | INTRAVENOUS | Status: AC
  Administered 2022-01-13: 2000 mL via INTRAVENOUS

## 2022-01-13 MED ORDER — METHOCARBAMOL 1000 MG/10ML IJ SOLN: 500.0000 mg | Freq: Four times a day (QID) | INTRAVENOUS | Status: DC | PRN

## 2022-01-13 MED ORDER — VITAL 1.5 CAL PO LIQD
1000.0000 mL | ORAL | Status: DC
  Administered 2022-01-13: 1000 mL

## 2022-01-13 MED ORDER — VITAL HIGH PROTEIN PO LIQD: 1000.0000 mL | ORAL | Status: DC

## 2022-01-13 MED ORDER — POLYETHYLENE GLYCOL 3350 17 G PO PACK: 17.0000 g | PACK | Freq: Every day | ORAL | Status: DC | PRN

## 2022-01-13 MED ORDER — VANCOMYCIN HCL 1000 MG IV SOLR
INTRAVENOUS | Status: DC | PRN
  Administered 2022-01-13: 1000 mg

## 2022-01-13 MED ORDER — ONDANSETRON HCL 4 MG/2ML IJ SOLN: 4.0000 mg | Freq: Four times a day (QID) | INTRAMUSCULAR | Status: DC | PRN

## 2022-01-13 MED ORDER — TRANEXAMIC ACID-NACL 1000-0.7 MG/100ML-% IV SOLN
INTRAVENOUS | Status: AC
  Filled 2022-01-13: qty 100

## 2022-01-13 MED ORDER — ORAL CARE MOUTH RINSE: 15.0000 mL | OROMUCOSAL | Status: DC | PRN

## 2022-01-13 MED ORDER — ALBUMIN HUMAN 5 % IV SOLN
25.0000 g | Freq: Once | INTRAVENOUS | Status: AC
  Administered 2022-01-13: 25 g via INTRAVENOUS
  Filled 2022-01-13: qty 500

## 2022-01-13 MED ORDER — CEFAZOLIN SODIUM-DEXTROSE 2-4 GM/100ML-% IV SOLN
2.0000 g | Freq: Two times a day (BID) | INTRAVENOUS | Status: AC
  Administered 2022-01-13 – 2022-01-14 (×2): 2 g via INTRAVENOUS
  Filled 2022-01-13 (×2): qty 100

## 2022-01-13 MED ORDER — ROCURONIUM BROMIDE 10 MG/ML (PF) SYRINGE
PREFILLED_SYRINGE | INTRAVENOUS | Status: AC
  Filled 2022-01-13: qty 10

## 2022-01-13 MED ORDER — BUPIVACAINE HCL (PF) 0.5 % IJ SOLN
INTRAMUSCULAR | Status: AC
  Filled 2022-01-13: qty 30

## 2022-01-13 MED ORDER — ALBUMIN HUMAN 5 % IV SOLN: INTRAVENOUS | Status: DC | PRN

## 2022-01-13 MED ORDER — DOCUSATE SODIUM 50 MG/5ML PO LIQD
100.0000 mg | Freq: Two times a day (BID) | ORAL | Status: DC
  Administered 2022-01-13 – 2022-01-17 (×5): 100 mg
  Filled 2022-01-13 (×5): qty 10

## 2022-01-13 MED ORDER — SODIUM CHLORIDE 0.9% IV SOLUTION: Freq: Once | INTRAVENOUS | Status: DC

## 2022-01-13 MED ORDER — MIDAZOLAM HCL 2 MG/2ML IJ SOLN
INTRAMUSCULAR | Status: DC | PRN
  Administered 2022-01-13: 2 mg via INTRAVENOUS

## 2022-01-13 MED ORDER — ONDANSETRON HCL 4 MG PO TABS: 4.0000 mg | ORAL_TABLET | Freq: Four times a day (QID) | ORAL | Status: DC | PRN

## 2022-01-13 SURGICAL SUPPLY — 102 items
ADH SKN CLS APL DERMABOND .7 (GAUZE/BANDAGES/DRESSINGS) ×2
APL PRP STRL LF DISP 70% ISPRP (MISCELLANEOUS) ×4
BAG COUNTER SPONGE SURGICOUNT (BAG) ×2 IMPLANT
BAG SPNG CNTER NS LX DISP (BAG) ×2
BANDAGE ESMARK 6X9 LF (GAUZE/BANDAGES/DRESSINGS) ×2 IMPLANT
BIT DRILL CANN 4.5MM (BIT) IMPLANT
BIT DRILL QC 2.0 SHORT EVOS SM (DRILL) IMPLANT
BIT DRILL QC 2.5MM SHRT EVO SM (DRILL) IMPLANT
BLADE SURG 11 STRL SS (BLADE) IMPLANT
BNDG CMPR 9X6 STRL LF SNTH (GAUZE/BANDAGES/DRESSINGS)
BNDG CMPR STD VLCR NS LF 5.8X4 (GAUZE/BANDAGES/DRESSINGS) ×2
BNDG COHESIVE 4X5 TAN STRL (GAUZE/BANDAGES/DRESSINGS) ×2 IMPLANT
BNDG ELASTIC 4X5.8 VLCR NS LF (GAUZE/BANDAGES/DRESSINGS) IMPLANT
BNDG ELASTIC 4X5.8 VLCR STR LF (GAUZE/BANDAGES/DRESSINGS) IMPLANT
BNDG ELASTIC 6X5.8 VLCR STR LF (GAUZE/BANDAGES/DRESSINGS) IMPLANT
BNDG ESMARK 6X9 LF (GAUZE/BANDAGES/DRESSINGS)
BRUSH SCRUB EZ PLAIN DRY (MISCELLANEOUS) ×4 IMPLANT
CHLORAPREP W/TINT 26 (MISCELLANEOUS) ×2 IMPLANT
COVER SURGICAL LIGHT HANDLE (MISCELLANEOUS) ×2 IMPLANT
DERMABOND ADVANCED (GAUZE/BANDAGES/DRESSINGS) ×2
DERMABOND ADVANCED .7 DNX12 (GAUZE/BANDAGES/DRESSINGS) IMPLANT
DRAPE C-ARM 42X72 X-RAY (DRAPES) ×2 IMPLANT
DRAPE C-ARMOR (DRAPES) ×2 IMPLANT
DRAPE INCISE IOBAN 66X45 STRL (DRAPES) ×2 IMPLANT
DRAPE LAPAROTOMY TRNSV 102X78 (DRAPES) ×2 IMPLANT
DRAPE ORTHO SPLIT 77X108 STRL (DRAPES) ×4
DRAPE SURG ORHT 6 SPLT 77X108 (DRAPES) ×4 IMPLANT
DRAPE U-SHAPE 47X51 STRL (DRAPES) ×2 IMPLANT
DRESSING MEPILEX FLEX 4X4 (GAUZE/BANDAGES/DRESSINGS) IMPLANT
DRILL BIT CANN 4.5MM (BIT) ×2
DRILL QC 2.0 SHORT EVOS SM (DRILL) ×2
DRILL QC 2.5MM SHORT EVOS SM (DRILL) ×2
DRSG ADAPTIC 3X8 NADH LF (GAUZE/BANDAGES/DRESSINGS) IMPLANT
DRSG MEPILEX BORDER 4X4 (GAUZE/BANDAGES/DRESSINGS) ×2 IMPLANT
DRSG MEPILEX FLEX 4X4 (GAUZE/BANDAGES/DRESSINGS) ×4
DRSG MEPITEL 4X7.2 (GAUZE/BANDAGES/DRESSINGS) IMPLANT
DRSG OPSITE POSTOP 4X10 (GAUZE/BANDAGES/DRESSINGS) IMPLANT
DRSG TEGADERM 4X4.75 (GAUZE/BANDAGES/DRESSINGS) IMPLANT
ELECT REM PT RETURN 9FT ADLT (ELECTROSURGICAL) ×2
ELECTRODE REM PT RTRN 9FT ADLT (ELECTROSURGICAL) ×2 IMPLANT
GAUZE PAD ABD 8X10 STRL (GAUZE/BANDAGES/DRESSINGS) IMPLANT
GAUZE SPONGE 2X2 8PLY STRL LF (GAUZE/BANDAGES/DRESSINGS) IMPLANT
GAUZE SPONGE 4X4 12PLY STRL (GAUZE/BANDAGES/DRESSINGS) IMPLANT
GLOVE BIO SURGEON STRL SZ 6.5 (GLOVE) ×6 IMPLANT
GLOVE BIO SURGEON STRL SZ7.5 (GLOVE) ×8 IMPLANT
GLOVE BIOGEL PI IND STRL 6.5 (GLOVE) ×2 IMPLANT
GLOVE BIOGEL PI IND STRL 7.5 (GLOVE) ×2 IMPLANT
GLOVE BIOGEL PI INDICATOR 6.5 (GLOVE) ×6
GLOVE BIOGEL PI INDICATOR 7.5 (GLOVE) ×6
GOWN STRL REUS W/ TWL LRG LVL3 (GOWN DISPOSABLE) ×4 IMPLANT
GOWN STRL REUS W/TWL LRG LVL3 (GOWN DISPOSABLE) ×8
GUIDEWIRE 2.0MM (WIRE) IMPLANT
GUIDEWIRE THREADED 2.8MM (WIRE) IMPLANT
K-WIRE 1.6 (WIRE) ×2
K-WIRE FX150X1.6XTROC PNT (WIRE) ×2
KIT BASIN OR (CUSTOM PROCEDURE TRAY) ×2 IMPLANT
KIT TURNOVER KIT B (KITS) ×2 IMPLANT
KWIRE FX150X1.6XTROC PNT (WIRE) IMPLANT
MANIFOLD NEPTUNE II (INSTRUMENTS) ×2 IMPLANT
NDL HYPO 21X1.5 SAFETY (NEEDLE) IMPLANT
NDL HYPO 25GX1X1/2 BEV (NEEDLE) ×2 IMPLANT
NEEDLE HYPO 21X1.5 SAFETY (NEEDLE) IMPLANT
NEEDLE HYPO 25GX1X1/2 BEV (NEEDLE) ×2 IMPLANT
NS IRRIG 1000ML POUR BTL (IV SOLUTION) ×2 IMPLANT
PACK GENERAL/GYN (CUSTOM PROCEDURE TRAY) ×2 IMPLANT
PACK TOTAL JOINT (CUSTOM PROCEDURE TRAY) ×2 IMPLANT
PACK UNIVERSAL I (CUSTOM PROCEDURE TRAY) IMPLANT
PAD ARMBOARD 7.5X6 YLW CONV (MISCELLANEOUS) ×4 IMPLANT
PAD CAST 4YDX4 CTTN HI CHSV (CAST SUPPLIES) IMPLANT
PADDING CAST COTTON 4X4 STRL (CAST SUPPLIES) ×2
PADDING CAST COTTON 6X4 STRL (CAST SUPPLIES) IMPLANT
PLATE L.DISTAL 2.7/3.5 7H EVOS (Plate) IMPLANT
SCREW CANN 7.3X150MM (Screw) IMPLANT
SCREW CANN F/THREAD 6.5X75 (Screw) IMPLANT
SCREW CANN FT 7.3X80 (Screw) IMPLANT
SCREW CORT 2.7X16 ST EVOS (Screw) IMPLANT
SCREW CORT 2.7X17 T8 ST EVOS (Screw) IMPLANT
SCREW CORT 3.5X10MM ST EVOS (Screw) IMPLANT
SCREW CORT EVOS ST 3.5X12 (Screw) IMPLANT
SCREW CORT ST EVOS 3.5X46 (Screw) IMPLANT
SCREW CORT ST EVOS 3.5X55 (Screw) IMPLANT
SCREW CORT ST EVOS 3.5X60 (Screw) IMPLANT
SCREW CTX 3.5X34MM EVOS (Screw) IMPLANT
SCREW EVOS 2.7X18 LOCK T8 (Screw) IMPLANT
SPONGE T-LAP 18X18 ~~LOC~~+RFID (SPONGE) IMPLANT
STAPLER VISISTAT 35W (STAPLE) ×2 IMPLANT
SUCTION FRAZIER HANDLE 10FR (MISCELLANEOUS) ×2
SUCTION TUBE FRAZIER 10FR DISP (MISCELLANEOUS) ×2 IMPLANT
SUT ETHILON 3 0 PS 1 (SUTURE) ×4 IMPLANT
SUT MNCRL AB 3-0 PS2 18 (SUTURE) ×2 IMPLANT
SUT PROLENE 0 CT (SUTURE) IMPLANT
SUT VIC AB 0 CT1 27 (SUTURE)
SUT VIC AB 0 CT1 27XBRD ANBCTR (SUTURE) ×2 IMPLANT
SUT VIC AB 2-0 CT1 27 (SUTURE) ×4
SUT VIC AB 2-0 CT1 TAPERPNT 27 (SUTURE) ×4 IMPLANT
SUT VIC AB 2-0 FS1 27 (SUTURE) ×2 IMPLANT
SYR CONTROL 10ML LL (SYRINGE) ×2 IMPLANT
TOWEL GREEN STERILE (TOWEL DISPOSABLE) ×4 IMPLANT
TOWEL GREEN STERILE FF (TOWEL DISPOSABLE) ×2 IMPLANT
UNDERPAD 30X36 HEAVY ABSORB (UNDERPADS AND DIAPERS) ×2 IMPLANT
WASHER FOR 5.0 SCREWS (Washer) IMPLANT
WATER STERILE IRR 1000ML POUR (IV SOLUTION) ×2 IMPLANT

## 2022-01-13 NOTE — Anesthesia Preprocedure Evaluation (Addendum)
Anesthesia Evaluation  Patient identified by MRN, date of birth, ID band Patient unresponsive    Reviewed: Allergy & Precautions, Patient's Chart, lab work & pertinent test results, Unable to perform ROS - Chart review only  Airway Mallampati: Intubated       Dental   Pulmonary    Pulmonary exam normal        Cardiovascular hypertension, Pt. on medications Normal cardiovascular exam  Echo: 1. Left ventricular ejection fraction, by estimation, is 60 to 65%. The  left ventricle has normal function. The left ventricle has no regional  wall motion abnormalities. Left ventricular diastolic parameters were  normal.  2. Right ventricular systolic function is normal. The right ventricular  size is normal. There is mildly elevated pulmonary artery systolic  pressure.  3. Mild anterior leaflet prolaspe. Mild mitral valve regurgitation. No  evidence of mitral stenosis. There is mild prolapse of of the mitral  valve.  4. Tricuspid valve regurgitation is moderate to severe.  5. The aortic valve is tricuspid. Aortic valve regurgitation is not  visualized. No aortic stenosis is present.  6. The inferior vena cava is normal in size with greater than 50%  respiratory variability, suggesting right atrial pressure of 3 mmHg.    Neuro/Psych Anxiety negative neurological ROS     GI/Hepatic Neg liver ROS, GERD  ,  Endo/Other  diabetes  Renal/GU      Musculoskeletal  (+) Arthritis ,   Abdominal   Peds  Hematology negative hematology ROS (+)   Anesthesia Other Findings   Reproductive/Obstetrics                            Anesthesia Physical Anesthesia Plan  ASA: 3  Anesthesia Plan: General   Post-op Pain Management:    Induction: Intravenous  PONV Risk Score and Plan: 4 or greater and Ondansetron, Dexamethasone, Midazolam and Scopolamine patch - Pre-op  Airway Management Planned: Oral  ETT  Additional Equipment: None  Intra-op Plan:   Post-operative Plan: Post-operative intubation/ventilation  Informed Consent:     History available from chart only  Plan Discussed with: CRNA  Anesthesia Plan Comments:       Anesthesia Quick Evaluation

## 2022-01-13 NOTE — Progress Notes (Signed)
Orthopedic Tech Progress Note Patient Details:  Kendra Harper May 03, 1941 [id number]  Patient ID: Kendra Harper, female   DOB: 13-Mar-1941, 81 y.o.   MRN: [id number] No OHF, multiple spinal fractures. Kendra Harper 01/01/2022, 5:05 PM
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
## 2022-01-13 NOTE — Transfer of Care (Signed)
Immediate Anesthesia Transfer of Care Note  Patient: Kendra Harper  Procedure(s) Performed: SURIGICAL FIXATION OF PELVIS (Pelvis) SURGICAL FIXATION OF LEFT ANKLE (Left: Ankle)  Patient Location: ICU  Anesthesia Type:General  Level of Consciousness: unresponsive and Patient remains intubated per anesthesia plan  Airway & Oxygen Therapy: Patient remains intubated per anesthesia plan and Patient placed on Ventilator (see vital sign flow sheet for setting)  Post-op Assessment: Report given to RN and Post -op Vital signs reviewed and stable  Post vital signs: Reviewed and stable  Last Vitals:  Vitals Value Taken Time  BP    Temp    Pulse 97 01/08/2022 1036  Resp 18 01/04/2022 1036  SpO2 93 % 12/26/2021 1036  Vitals shown include unvalidated device data.  Last Pain:  Vitals:   01/07/2022 0400  TempSrc: Axillary  PainSc:          Complications: No notable events documented.

## 2022-01-13 NOTE — Procedures (Signed)
Cortrak  Person Inserting Tube:  Kendra Harper, RD Tube Type:  Cortrak - 43 inches Tube Size:  10 Tube Location:  Left nare Secured by: Bridle Technique Used to Measure Tube Placement:  Marking at nare/corner of mouth Cortrak Secured At:  64 cm Procedure Comments:  Cortrak Tube Team Note:  Consult received to place a Cortrak feeding tube.   X-ray is required, abdominal x-ray has been ordered by the Cortrak team. Please confirm tube placement before using the Cortrak tube.   If the tube becomes dislodged please keep the tube and contact the Cortrak team at [email or web address] (password [email or web address]) for replacement.  If after hours and replacement cannot be delayed, place a NG tube and confirm placement with an abdominal x-ray.    Kerman Passey MS, RDN, LDN, CNSC Registered Dietitian 3 Clinical Nutrition RD Pager and On-Call Pager Number Located in [place]

## 2022-01-13 NOTE — Progress Notes (Signed)
Pt transported on vent from 4N25 to CT and back without any complications. RN at bedside, RT will monitor.  
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
## 2022-01-13 NOTE — Anesthesia Postprocedure Evaluation (Signed)
Anesthesia Post Note  Patient: Kendra Harper  Procedure(s) Performed: SURIGICAL FIXATION OF PELVIS (Pelvis) SURGICAL FIXATION OF LEFT ANKLE (Left: Ankle)     Patient location during evaluation: SICU Anesthesia Type: General Level of consciousness: sedated Pain management: pain level controlled Vital Signs Assessment: post-procedure vital signs reviewed and stable Respiratory status: patient remains intubated per anesthesia plan Cardiovascular status: stable Postop Assessment: no apparent nausea or vomiting Anesthetic complications: no   No notable events documented.  Last Vitals:  Vitals:   01/19/2022 0715 12/27/2021 1100  BP:  (!) 149/52  Pulse: (!) 114 (!) 115  Resp: 16 18  Temp:    SpO2: 99% 91%    Last Pain:  Vitals:   01/11/2022 0400  TempSrc: Axillary  PainSc:                  Kendra Harper

## 2022-01-13 NOTE — Anesthesia Postprocedure Evaluation (Signed)
Anesthesia Post Note  Patient: Kendra Harper  Procedure(s) Performed: EXPLORATORY LAPAROTOMY (Abdomen) CYSTOSCOPY FLEXIBLE (Perineum) BLADDER REPAIR (Abdomen)     Patient location during evaluation: ICU Anesthesia Type: General Level of consciousness: sedated and patient remains intubated per anesthesia plan Pain management: pain level controlled Vital Signs Assessment: post-procedure vital signs reviewed and stable Respiratory status: patient remains intubated per anesthesia plan Cardiovascular status: stable Anesthetic complications: no   No notable events documented.  Last Vitals:  Vitals:   12/23/2021 0200 12/20/2021 0215  BP: 110/66 113/64  Pulse:  (!) 107  Resp: 18 18  Temp:    SpO2:  99%    Last Pain:  Vitals:   12/26/2021 0000  TempSrc: Axillary  PainSc:                  Nolon Nations
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
## 2022-01-13 NOTE — Inpatient Diabetes Management (Addendum)
Inpatient Diabetes Program Recommendations  AACE/ADA: New Consensus Statement on Inpatient Glycemic Control (2015)  Target Ranges:  Prepandial:   less than 140 mg/dL      Peak postprandial:   less than 180 mg/dL (1-2 hours)      Critically ill patients:  140 - 180 mg/dL   Lab Results  Component Value Date   GLUCAP 230 (H) 12/23/2021   HGBA1C 5.9 (H) 01/04/2022    Review of Glycemic Control  Diabetes history: DM1(does not make insulin.  Needs correction, basal and meal coverage)  Outpatient Diabetes medications: Tandem insulin pump & Dexcom Basal: 12a-2a--0.35 2a-7a--2.25 7a-12a--0.35 Total basal for 24 hrs is 8.9 units Insulin Carb ratio--1 units for every 17 carbs Insulin sensitivity factor-1 unit drops her 66.5 mg/dL Target blood glucose--120 mg/dL  Current orders for Inpatient glycemic control:  Novolog 0-9 units Q4H; NPO  Inpatient Diabetes Program Recommendations:    Semglee 6 units QD  Will continue to follow while inpatient.  Thank you, Reche Dixon, MSN, [facility] Diabetes Coordinator Inpatient Diabetes Program [phone] (team pager from 8a-5p)
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
## 2022-01-14 ENCOUNTER — Inpatient Hospital Stay (HOSPITAL_COMMUNITY): Payer: Medicare HMO

## 2022-01-14 DIAGNOSIS — N179 Acute kidney failure, unspecified: Secondary | ICD-10-CM | POA: Diagnosis not present

## 2022-01-14 LAB — URINALYSIS, ROUTINE W REFLEX MICROSCOPIC
Bilirubin Urine: NEGATIVE
Glucose, UA: 50 mg/dL — AB
Ketones, ur: NEGATIVE mg/dL
Leukocytes,Ua: NEGATIVE
Nitrite: NEGATIVE
Protein, ur: 100 mg/dL — AB
RBC / HPF: >50 RBC/hpf — ABNORMAL HIGH (ref 0–5)
Specific Gravity, Urine: 1.024 (ref 1.005–1.030)
pH: 5 (ref 5.0–8.0)

## 2022-01-14 LAB — TRAUMA TEG PANEL
CFF Max Amplitude: 8.6 mm — ABNORMAL LOW (ref 15–32)
CFF Max Amplitude: 19.3 mm (ref 15–32)
Citrated Kaolin (R): 9.8 min — ABNORMAL HIGH (ref 4.6–9.1)
Citrated Kaolin (R): 8.2 min (ref 4.6–9.1)
Citrated Rapid TEG (MA): 42.7 mm — ABNORMAL LOW (ref 52–70)
Citrated Rapid TEG (MA): 56.8 mm (ref 52–70)
Lysis at 30 Minutes: 0 % (ref 0.0–2.6)
Lysis at 30 Minutes: 0 % (ref 0.0–2.6)

## 2022-01-14 LAB — POCT I-STAT 7, (LYTES, BLD GAS, ICA,H+H)
Acid-base deficit: 13 mmol/L — ABNORMAL HIGH (ref 0.0–2.0)
Bicarbonate: 14.4 mmol/L — ABNORMAL LOW (ref 20.0–28.0)
Calcium, Ion: 0.96 mmol/L — ABNORMAL LOW (ref 1.15–1.40)
HCT: 24 % — ABNORMAL LOW (ref 36.0–46.0)
Hemoglobin: 8.2 g/dL — ABNORMAL LOW (ref 12.0–15.0)
O2 Saturation: 98 %
Patient temperature: 36.1
Potassium: 5.4 mmol/L — ABNORMAL HIGH (ref 3.5–5.1)
Sodium: 140 mmol/L (ref 135–145)
TCO2: 16 mmol/L — ABNORMAL LOW (ref 22–32)
pCO2 arterial: 36.7 mmHg (ref 32–48)
pH, Arterial: 7.196 — CL (ref 7.35–7.45)
pO2, Arterial: 116 mmHg — ABNORMAL HIGH (ref 83–108)

## 2022-01-14 LAB — BASIC METABOLIC PANEL
Anion gap: 13 (ref 5–15)
Anion gap: 13 (ref 5–15)
BUN: 42 mg/dL — ABNORMAL HIGH (ref 8–23)
BUN: 46 mg/dL — ABNORMAL HIGH (ref 8–23)
CO2: 15 mmol/L — ABNORMAL LOW (ref 22–32)
CO2: 17 mmol/L — ABNORMAL LOW (ref 22–32)
Calcium: 6.7 mg/dL — ABNORMAL LOW (ref 8.9–10.3)
Calcium: 7 mg/dL — ABNORMAL LOW (ref 8.9–10.3)
Chloride: 110 mmol/L (ref 98–111)
Chloride: 109 mmol/L (ref 98–111)
Creatinine, Ser: 2.2 mg/dL — ABNORMAL HIGH (ref 0.44–1.00)
Creatinine, Ser: 2.25 mg/dL — ABNORMAL HIGH (ref 0.44–1.00)
GFR, Estimated: 22 mL/min — ABNORMAL LOW (ref >60)
GFR, Estimated: 22 mL/min — ABNORMAL LOW (ref >60)
Glucose, Bld: 204 mg/dL — ABNORMAL HIGH (ref 70–99)
Glucose, Bld: 194 mg/dL — ABNORMAL HIGH (ref 70–99)
Potassium: 5.5 mmol/L — ABNORMAL HIGH (ref 3.5–5.1)
Potassium: 5.6 mmol/L — ABNORMAL HIGH (ref 3.5–5.1)
Sodium: 138 mmol/L (ref 135–145)
Sodium: 139 mmol/L (ref 135–145)

## 2022-01-14 LAB — PREPARE PLATELET PHERESIS: Unit division: 0

## 2022-01-14 LAB — CBC
HCT: 28.4 % — ABNORMAL LOW (ref 36.0–46.0)
HCT: 28.6 % — ABNORMAL LOW (ref 36.0–46.0)
HCT: 30.8 % — ABNORMAL LOW (ref 36.0–46.0)
Hemoglobin: 10.4 g/dL — ABNORMAL LOW (ref 12.0–15.0)
Hemoglobin: 9.6 g/dL — ABNORMAL LOW (ref 12.0–15.0)
Hemoglobin: 9.7 g/dL — ABNORMAL LOW (ref 12.0–15.0)
MCH: 27.8 pg (ref 26.0–34.0)
MCH: 27.8 pg (ref 26.0–34.0)
MCH: 28.4 pg (ref 26.0–34.0)
MCHC: 33.6 g/dL (ref 30.0–36.0)
MCHC: 33.8 g/dL (ref 30.0–36.0)
MCHC: 34.2 g/dL (ref 30.0–36.0)
MCV: 82.4 fL (ref 80.0–100.0)
MCV: 82.9 fL (ref 80.0–100.0)
MCV: 83.3 fL (ref 80.0–100.0)
Platelets: 118 10*3/uL — ABNORMAL LOW (ref 150–400)
Platelets: 78 10*3/uL — ABNORMAL LOW (ref 150–400)
Platelets: 97 10*3/uL — ABNORMAL LOW (ref 150–400)
RBC: 3.41 MIL/uL — ABNORMAL LOW (ref 3.87–5.11)
RBC: 3.45 MIL/uL — ABNORMAL LOW (ref 3.87–5.11)
RBC: 3.74 MIL/uL — ABNORMAL LOW (ref 3.87–5.11)
RDW: 16.9 % — ABNORMAL HIGH (ref 11.5–15.5)
RDW: 17.5 % — ABNORMAL HIGH (ref 11.5–15.5)
RDW: 17.5 % — ABNORMAL HIGH (ref 11.5–15.5)
WBC: 3.3 10*3/uL — ABNORMAL LOW (ref 4.0–10.5)
WBC: 6.3 10*3/uL (ref 4.0–10.5)
WBC: 6.4 10*3/uL (ref 4.0–10.5)
nRBC: 0.8 % — ABNORMAL HIGH (ref 0.0–0.2)
nRBC: 0.8 % — ABNORMAL HIGH (ref 0.0–0.2)
nRBC: 4.2 % — ABNORMAL HIGH (ref 0.0–0.2)

## 2022-01-14 LAB — PROTIME-INR
INR: 2.7 — ABNORMAL HIGH (ref 0.8–1.2)
INR: 2.1 — ABNORMAL HIGH (ref 0.8–1.2)
Prothrombin Time: 28.1 seconds — ABNORMAL HIGH (ref 11.4–15.2)
Prothrombin Time: 23.5 seconds — ABNORMAL HIGH (ref 11.4–15.2)

## 2022-01-14 LAB — GLUCOSE, CAPILLARY
Glucose-Capillary: 210 mg/dL — ABNORMAL HIGH (ref 70–99)
Glucose-Capillary: 164 mg/dL — ABNORMAL HIGH (ref 70–99)
Glucose-Capillary: 183 mg/dL — ABNORMAL HIGH (ref 70–99)
Glucose-Capillary: 163 mg/dL — ABNORMAL HIGH (ref 70–99)
Glucose-Capillary: 169 mg/dL — ABNORMAL HIGH (ref 70–99)
Glucose-Capillary: 104 mg/dL — ABNORMAL HIGH (ref 70–99)

## 2022-01-14 LAB — RENAL FUNCTION PANEL
Albumin: 2.8 g/dL — ABNORMAL LOW (ref 3.5–5.0)
Anion gap: 12 (ref 5–15)
BUN: 43 mg/dL — ABNORMAL HIGH (ref 8–23)
CO2: 17 mmol/L — ABNORMAL LOW (ref 22–32)
Calcium: 6.5 mg/dL — ABNORMAL LOW (ref 8.9–10.3)
Chloride: 111 mmol/L (ref 98–111)
Creatinine, Ser: 2.24 mg/dL — ABNORMAL HIGH (ref 0.44–1.00)
GFR, Estimated: 22 mL/min — ABNORMAL LOW (ref >60)
Glucose, Bld: 195 mg/dL — ABNORMAL HIGH (ref 70–99)
Phosphorus: 7.3 mg/dL — ABNORMAL HIGH (ref 2.5–4.6)
Potassium: 5.6 mmol/L — ABNORMAL HIGH (ref 3.5–5.1)
Sodium: 140 mmol/L (ref 135–145)

## 2022-01-14 LAB — LACTIC ACID, PLASMA: Lactic Acid, Venous: 6.8 mmol/L (ref 0.5–1.9)

## 2022-01-14 LAB — BPAM PLATELET PHERESIS
Blood Product Expiration Date: 202308272359
ISSUE DATE / TIME: 202308252104
Unit Type and Rh: 6200

## 2022-01-14 LAB — VITAMIN D 25 HYDROXY (VIT D DEFICIENCY, FRACTURES): Vit D, 25-Hydroxy: 25.67 ng/mL — ABNORMAL LOW (ref 30–100)

## 2022-01-14 LAB — PHOSPHORUS
Phosphorus: 7.6 mg/dL — ABNORMAL HIGH (ref 2.5–4.6)
Phosphorus: 7.9 mg/dL — ABNORMAL HIGH (ref 2.5–4.6)

## 2022-01-14 LAB — MAGNESIUM
Magnesium: 1.8 mg/dL (ref 1.7–2.4)
Magnesium: 1.8 mg/dL (ref 1.7–2.4)

## 2022-01-14 LAB — PREPARE RBC (CROSSMATCH)

## 2022-01-14 MED ORDER — SODIUM CHLORIDE 0.9 % FOR CRRT: INTRAVENOUS_CENTRAL | Status: DC | PRN

## 2022-01-14 MED ORDER — STERILE WATER FOR INJECTION IV SOLN
INTRAVENOUS | Status: DC
  Filled 2022-01-14 (×2): qty 1000

## 2022-01-14 MED ORDER — SODIUM CHLORIDE 0.9 % IV SOLN
1.0000 g | INTRAVENOUS | Status: DC
  Administered 2022-01-14 – 2022-01-16 (×3): 1 g via INTRAVENOUS
  Filled 2022-01-14 (×4): qty 10

## 2022-01-14 MED ORDER — PANTOPRAZOLE SODIUM 40 MG IV SOLR
40.0000 mg | Freq: Two times a day (BID) | INTRAVENOUS | Status: DC
  Administered 2022-01-14 – 2022-01-17 (×6): 40 mg via INTRAVENOUS
  Filled 2022-01-14 (×6): qty 10

## 2022-01-14 MED ORDER — SODIUM CHLORIDE 0.9 % IV BOLUS
1000.0000 mL | Freq: Once | INTRAVENOUS | Status: AC
Start: 2022-01-14 — End: 2022-01-14
  Administered 2022-01-14: 1000 mL via INTRAVENOUS

## 2022-01-14 MED ORDER — STERILE WATER FOR INJECTION IV SOLN
INTRAVENOUS | Status: DC
  Filled 2022-01-14 (×2): qty 150

## 2022-01-14 MED ORDER — SODIUM CHLORIDE 0.9% IV SOLUTION: Freq: Once | INTRAVENOUS | Status: AC

## 2022-01-14 MED ORDER — PRISMASOL BGK 4/2.5 32-4-2.5 MEQ/L EC SOLN
Status: DC
  Filled 2022-01-14 (×27): qty 5000

## 2022-01-14 MED ORDER — ALBUMIN HUMAN 25 % IV SOLN
12.5000 g | Freq: Once | INTRAVENOUS | Status: AC
Start: 2022-01-14 — End: 2022-01-14
  Administered 2022-01-14: 12.5 g via INTRAVENOUS
  Filled 2022-01-14: qty 50

## 2022-01-14 MED ORDER — CHLORHEXIDINE GLUCONATE CLOTH 2 % EX PADS
6.0000 | MEDICATED_PAD | Freq: Every day | CUTANEOUS | Status: DC
  Administered 2022-01-15 – 2022-01-16 (×3): 6 via TOPICAL

## 2022-01-14 MED ORDER — HEPARIN SODIUM (PORCINE) 1000 UNIT/ML DIALYSIS
1000.0000 [IU] | INTRAMUSCULAR | Status: DC | PRN
  Administered 2022-01-17: 3200 [IU] via INTRAVENOUS_CENTRAL
  Filled 2022-01-14: qty 4
  Filled 2022-01-14: qty 6

## 2022-01-14 MED ORDER — CALCIUM GLUCONATE-NACL 2-0.675 GM/100ML-% IV SOLN
2.0000 g | Freq: Once | INTRAVENOUS | Status: AC
  Administered 2022-01-14: 2000 mg via INTRAVENOUS
  Filled 2022-01-14: qty 100

## 2022-01-14 MED ORDER — INSULIN GLARGINE-YFGN 100 UNIT/ML ~~LOC~~ SOLN
10.0000 [IU] | Freq: Every day | SUBCUTANEOUS | Status: DC
  Administered 2022-01-14 – 2022-01-16 (×3): 10 [IU] via SUBCUTANEOUS
  Filled 2022-01-14 (×4): qty 0.1

## 2022-01-14 MED ORDER — SODIUM CHLORIDE 0.9% IV SOLUTION: Freq: Once | INTRAVENOUS | Status: DC

## 2022-01-14 MED ORDER — ALTEPLASE 2 MG IJ SOLR: 2.0000 mg | Freq: Once | INTRAMUSCULAR | Status: DC | PRN

## 2022-01-14 NOTE — Progress Notes (Signed)
Patient ID: Kendra Harper, female   DOB: Sep 13, 1940, 81 y.o.   MRN: [id number]  Foley in place but draining scant urine.  Bladder scan showed no significant residual.    Cr. Continues to rise and is 2.24 with ATN.  Traumatic bladder rupture s/p repair.   We will continue to follow.
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
## 2022-01-14 NOTE — Procedures (Signed)
Central Venous Catheter Insertion Procedure Note  Kendra Harper  [id number]  21-Feb-1941  Date:01/14/22  Time:6:48 PM   Provider Performing:Claude Waldman Mauricio Po   Procedure: Insertion of Non-tunneled Central Venous Catheter(36556)with US guidance (40981)    Indication(s) Hemodialysis  Consent Risks of the procedure as well as the alternatives and risks of each were explained to the patient and/or caregiver.  Consent for the procedure was obtained and is signed in the bedside chart  Anesthesia Topical only with 1% lidocaine   Timeout Verified patient identification, verified procedure, site/side was marked, verified correct patient position, special equipment/implants available, medications/allergies/relevant history reviewed, required imaging and test results available.  Sterile Technique Maximal sterile technique including full sterile barrier drape, hand hygiene, sterile gown, sterile gloves, mask, hair covering, sterile ultrasound probe cover (if used).  Procedure Description Area of catheter insertion was cleaned with chlorhexidine and draped in sterile fashion.   With real-time ultrasound guidance a HD catheter was placed into the right femoral vein.  Nonpulsatile blood flow and easy flushing noted in all ports.  The catheter was sutured in place and sterile dressing applied.  Complications/Tolerance None; patient tolerated the procedure well. Chest X-ray is ordered to verify placement for internal jugular or subclavian cannulation.  Chest x-ray is not ordered for femoral cannulation.  EBL Minimal  Specimen(s) None  Wire cannulating right femoral vein.
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
## 2022-01-14 NOTE — Progress Notes (Signed)
OT Cancellation Note  Patient Details Name: Kendra Harper MRN: [id number] DOB: 03-17-1941   Cancelled Treatment:    Reason Eval/Treat Not Completed: Patient not medically ready (Per dicussion with MD, pt not appropriate for therapies at this time. WB status update and therapy evaluations to f/u post extubation.)  Yuna Pizzolato A Patrcia Schnepp 01/14/2022, 9:22 AM
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
## 2022-01-14 NOTE — Progress Notes (Addendum)
Night events:  2100: Dr. Bobbye Morton rounded, new order for platelets and a blood gas post blood products.  2230: Inadequate urine output per hour, [place]ick paged, new order for 1L LR bolus.  0000: Abg resulted, Dr. Thermon Leyland paged (in OR at the time) no new orders. Abd incision honeycomb dressing soaked in frank red blood.  0200: bladder scan- 34m  0300: Stechschulte rounded on patient, new orders: labs including lactic acid, PT INR and to draw morning labs. 1L NS bolus, 25% albumin, 2U PRBC. He also looked at her abd incision, took the last two staples out to see where bleeding was coming from, packed the wound and redressed.  0500: patient vomited tan liquid suspicious for tube feed contents. Tube feeding stopped. Patient given a bath, and vomited again at 0530. OG placed and hooked to suction. Abd dressing redressed because it was saturated.  0518: Stechschulte paged about vomit and morning labs resulting: potassium 5.5, creatinine 2.20, lactic acid 6.8, PTT 28.1, and INR 2.7. New order to give 2U FFP and let the day Drs round for the rest of the labs.   0700: upon assessment with day shift rn, abd dressing is saturated again and leaked onto bed pad.  0800: family notified of night time events and are on the way in to visit.

## 2022-01-14 NOTE — Progress Notes (Signed)
AM Teg indicated need for plt and cryo vs FFP Ordered plts and cryo Will repeat labs in the pm and reassess coag and blood needs  Leighton Ruff. Redmond Pulling, MD, FACS General, Bariatric, & Minimally Invasive Surgery [facility], [place]
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
## 2022-01-15 LAB — RENAL FUNCTION PANEL
Albumin: 2.5 g/dL — ABNORMAL LOW (ref 3.5–5.0)
Albumin: 2.3 g/dL — ABNORMAL LOW (ref 3.5–5.0)
Anion gap: 8 (ref 5–15)
Anion gap: 7 (ref 5–15)
BUN: 35 mg/dL — ABNORMAL HIGH (ref 8–23)
BUN: 24 mg/dL — ABNORMAL HIGH (ref 8–23)
CO2: 24 mmol/L (ref 22–32)
CO2: 23 mmol/L (ref 22–32)
Calcium: 6.7 mg/dL — ABNORMAL LOW (ref 8.9–10.3)
Calcium: 6.8 mg/dL — ABNORMAL LOW (ref 8.9–10.3)
Chloride: 106 mmol/L (ref 98–111)
Chloride: 108 mmol/L (ref 98–111)
Creatinine, Ser: 1.56 mg/dL — ABNORMAL HIGH (ref 0.44–1.00)
Creatinine, Ser: 1.29 mg/dL — ABNORMAL HIGH (ref 0.44–1.00)
GFR, Estimated: 34 mL/min — ABNORMAL LOW (ref >60)
GFR, Estimated: 42 mL/min — ABNORMAL LOW (ref >60)
Glucose, Bld: 128 mg/dL — ABNORMAL HIGH (ref 70–99)
Glucose, Bld: 118 mg/dL — ABNORMAL HIGH (ref 70–99)
Phosphorus: 4.7 mg/dL — ABNORMAL HIGH (ref 2.5–4.6)
Phosphorus: 3.3 mg/dL (ref 2.5–4.6)
Potassium: 4.7 mmol/L (ref 3.5–5.1)
Potassium: 4.8 mmol/L (ref 3.5–5.1)
Sodium: 138 mmol/L (ref 135–145)
Sodium: 138 mmol/L (ref 135–145)

## 2022-01-15 LAB — PROTIME-INR
INR: 1.9 — ABNORMAL HIGH (ref 0.8–1.2)
Prothrombin Time: 21.9 seconds — ABNORMAL HIGH (ref 11.4–15.2)

## 2022-01-15 LAB — CBC
HCT: 31.3 % — ABNORMAL LOW (ref 36.0–46.0)
Hemoglobin: 10.7 g/dL — ABNORMAL LOW (ref 12.0–15.0)
MCH: 28.5 pg (ref 26.0–34.0)
MCHC: 34.2 g/dL (ref 30.0–36.0)
MCV: 83.5 fL (ref 80.0–100.0)
Platelets: 68 10*3/uL — ABNORMAL LOW (ref 150–400)
RBC: 3.75 MIL/uL — ABNORMAL LOW (ref 3.87–5.11)
RDW: 17.5 % — ABNORMAL HIGH (ref 11.5–15.5)
WBC: 5.1 10*3/uL (ref 4.0–10.5)
nRBC: 3.7 % — ABNORMAL HIGH (ref 0.0–0.2)

## 2022-01-15 LAB — PREPARE FRESH FROZEN PLASMA
Unit division: 0
Unit division: 0
Unit division: 0

## 2022-01-15 LAB — HEPATIC FUNCTION PANEL
ALT: 102 U/L — ABNORMAL HIGH (ref 0–44)
AST: 395 U/L — ABNORMAL HIGH (ref 15–41)
Albumin: 2.5 g/dL — ABNORMAL LOW (ref 3.5–5.0)
Alkaline Phosphatase: 118 U/L (ref 38–126)
Bilirubin, Direct: 0.7 mg/dL — ABNORMAL HIGH (ref 0.0–0.2)
Indirect Bilirubin: 1.4 mg/dL — ABNORMAL HIGH (ref 0.3–0.9)
Total Bilirubin: 2.1 mg/dL — ABNORMAL HIGH (ref 0.3–1.2)
Total Protein: 4.2 g/dL — ABNORMAL LOW (ref 6.5–8.1)

## 2022-01-15 LAB — GLOBAL TEG PANEL
CFF Max Amplitude: 23.7 mm (ref 15–32)
CK with Heparinase (R): 6.9 min (ref 4.3–8.3)
Citrated Functional Fibrinogen: 432.5 mg/dL (ref 278–581)
Citrated Kaolin (K): 1.6 min (ref 0.8–2.1)
Citrated Kaolin (MA): 59.8 mm (ref 52–69)
Citrated Kaolin (R): 6.9 min (ref 4.6–9.1)
Citrated Kaolin Angle: 70.6 deg (ref 63–78)
Citrated Rapid TEG (MA): 60.1 mm (ref 52–70)

## 2022-01-15 LAB — BPAM PLATELET PHERESIS
Blood Product Expiration Date: 202308272359
Blood Product Expiration Date: 202308272359
Blood Product Expiration Date: 202308282359
Blood Product Expiration Date: 202308282359
ISSUE DATE / TIME: 202308261131
ISSUE DATE / TIME: 202308261131
ISSUE DATE / TIME: 202308261827
ISSUE DATE / TIME: 202308261827
Unit Type and Rh: 6200
Unit Type and Rh: 6200
Unit Type and Rh: 6200
Unit Type and Rh: 6200

## 2022-01-15 LAB — POCT I-STAT 7, (LYTES, BLD GAS, ICA,H+H)
Acid-Base Excess: 1 mmol/L (ref 0.0–2.0)
Bicarbonate: 26.4 mmol/L (ref 20.0–28.0)
Calcium, Ion: 0.93 mmol/L — ABNORMAL LOW (ref 1.15–1.40)
HCT: 27 % — ABNORMAL LOW (ref 36.0–46.0)
Hemoglobin: 9.2 g/dL — ABNORMAL LOW (ref 12.0–15.0)
O2 Saturation: 97 %
Patient temperature: 35.4
Potassium: 4.6 mmol/L (ref 3.5–5.1)
Sodium: 138 mmol/L (ref 135–145)
TCO2: 28 mmol/L (ref 22–32)
pCO2 arterial: 42.1 mmHg (ref 32–48)
pH, Arterial: 7.398 (ref 7.35–7.45)
pO2, Arterial: 84 mmHg (ref 83–108)

## 2022-01-15 LAB — BPAM FFP
Blood Product Expiration Date: 202308262359
Blood Product Expiration Date: 202308262359
Blood Product Expiration Date: 202308302359
Blood Product Expiration Date: 202308302359
ISSUE DATE / TIME: 202308252300
ISSUE DATE / TIME: 202308252300
ISSUE DATE / TIME: 202308260531
ISSUE DATE / TIME: 202308260531
Unit Type and Rh: 6200
Unit Type and Rh: 6200
Unit Type and Rh: 6200
Unit Type and Rh: 6200

## 2022-01-15 LAB — MAGNESIUM: Magnesium: 2.2 mg/dL (ref 1.7–2.4)

## 2022-01-15 LAB — GLUCOSE, CAPILLARY
Glucose-Capillary: 107 mg/dL — ABNORMAL HIGH (ref 70–99)
Glucose-Capillary: 112 mg/dL — ABNORMAL HIGH (ref 70–99)
Glucose-Capillary: 120 mg/dL — ABNORMAL HIGH (ref 70–99)
Glucose-Capillary: 128 mg/dL — ABNORMAL HIGH (ref 70–99)
Glucose-Capillary: 134 mg/dL — ABNORMAL HIGH (ref 70–99)
Glucose-Capillary: 141 mg/dL — ABNORMAL HIGH (ref 70–99)

## 2022-01-15 LAB — PREPARE PLATELET PHERESIS
Unit division: 0
Unit division: 0
Unit division: 0
Unit division: 0

## 2022-01-15 LAB — BPAM CRYOPRECIPITATE
Blood Product Expiration Date: 202308261646
ISSUE DATE / TIME: 202308261131
Unit Type and Rh: 5100

## 2022-01-15 LAB — APTT: aPTT: 40 seconds — ABNORMAL HIGH (ref 24–36)

## 2022-01-15 LAB — PREPARE CRYOPRECIPITATE: Unit division: 0

## 2022-01-15 MED ORDER — SODIUM CHLORIDE 0.9 % IV SOLN: INTRAVENOUS | Status: DC | PRN

## 2022-01-15 MED ORDER — CALCIUM GLUCONATE-NACL 2-0.675 GM/100ML-% IV SOLN
2.0000 g | Freq: Once | INTRAVENOUS | Status: AC
  Administered 2022-01-15: 2000 mg via INTRAVENOUS
  Filled 2022-01-15: qty 100

## 2022-01-15 MED ORDER — PRISMASOL BGK 4/2.5 32-4-2.5 MEQ/L REPLACEMENT SOLN
Status: DC
  Filled 2022-01-15 (×7): qty 5000

## 2022-01-15 MED ORDER — VITAMIN D 25 MCG (1000 UNIT) PO TABS: 2000.0000 [IU] | ORAL_TABLET | Freq: Every day | ORAL | Status: DC

## 2022-01-15 NOTE — Progress Notes (Signed)
Pt having frequent ectopy, PVC's and runs of nonsustained VT, with HR jumping to 140s.  Levo currently at 28mg, attempting to down titrate.  Dr. SThermon Leyland Trauma, on call notified.  Multiple labs ordered.

## 2022-01-15 NOTE — Progress Notes (Signed)
PT Cancellation Note  Patient Details Name: Zelphia Glover MRN: [id number] DOB: 01/08/1941   Cancelled Treatment:    Reason Eval/Treat Not Completed: Patient not medically ready. Pt remains intubated/sedated. Now on CRRT. PT to continue to follow.   Lorriane Shire 01/15/2022, 10:12 AM
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
## 2022-01-15 NOTE — Progress Notes (Signed)
Subjective: 2 Days Post-Op Procedure(s) (LRB): SURIGICAL FIXATION OF PELVIS (N/A) SURGICAL FIXATION OF LEFT ANKLE (Left) Patient intubated resting comfortably.  Objective: Vital signs in last 24 hours: Temp:  [94.5 F (34.7 C)-98.2 F (36.8 C)] 97.9 F (36.6 C) (08/27 0800) Pulse Rate:  [79-111] 107 (08/27 0800) Resp:  [14-21] 18 (08/27 0800) BP: (100-145)/(45-70) 122/55 (08/27 0800) SpO2:  [89 %-96 %] 93 % (08/27 0800) Arterial Line BP: (97-157)/(40-68) 106/48 (08/27 0800) FiO2 (%):  [40 %] 40 % (08/27 0720)  Intake/Output from previous day: 08/26 0701 - 08/27 0700 In: 4030 [I.V.:2242.6; Blood:1587.3; IV Piggyback:200] Out: 2389 [Urine:397; Emesis/NG output:450; Drains:397] Intake/Output this shift: Total I/O In: 92.3 [I.V.:92.3] Out: 253.9 [Urine:15; Drains:10; Other:100]  Recent Labs    01/14/22 0006 01/14/22 0339 01/14/22 1820 01/15/22 0008 01/15/22 0417  HGB 8.2* 10.4* 9.7* 9.2* 10.7*   Recent Labs    01/14/22 1820 01/15/22 0008 01/15/22 0417  WBC 3.3*  --  5.1  RBC 3.41*  --  3.75*  HCT 28.4* 27.0* 31.3*  PLT 78*  --  68*   Recent Labs    01/14/22 1406 01/15/22 0008 01/15/22 0417  NA 139 138 138  K 5.6* 4.6 4.7  CL 109  --  106  CO2 17*  --  24  BUN 46*  --  35*  CREATININE 2.25*  --  1.56*  GLUCOSE 194*  --  128*  CALCIUM 7.0*  --  6.7*   Recent Labs    01/14/22 1820 01/15/22 0417  INR 2.1* 1.9*     Assessment/Plan: 2 Days Post-Op Procedure(s) (LRB): SURIGICAL FIXATION OF PELVIS (N/A) SURGICAL FIXATION OF LEFT ANKLE (Left)  Weightbearing: TDWB LLE, WBAT RLE Insicional and dressing care: Dressings left intact until follow-up and Reinforce dressings as needed Orthopedic device(s): Splint Showering: Keep dressing dry VTE prophylaxis:  on hold by neuro  , SCDs, ambulation Pain control: continue current regimen Follow - up plan:  with Dr. Doretha Sou Kendra Harper 01/15/2022, 9:37 AM
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
## 2022-01-15 NOTE — Progress Notes (Signed)
0000: Nephrology paged about abg results of acidosis corrected. New orders to switch pre and post dialysate bags from bicarb to the normal 4K/2.5Ca bags at rates of 400 each. Verbal order to also stop intravenous bicarb gtt.   Was also able to come down on the levo gtt, mentioned this to nephrology, verbal order to change PFR goal from 0 to -150 as patient tolerates.    Over the night, midline abdomen dressing saturated about 10 dressing changes through out the night. Attempted to attach a ostomy bag to the open incision, unsuccessful and gauze dressing replaced. A wound vac might be the best dressing type in order to keep the patient's skin dry.
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
## 2022-01-16 ENCOUNTER — Encounter (HOSPITAL_COMMUNITY): Payer: Self-pay | Admitting: Student

## 2022-01-16 ENCOUNTER — Inpatient Hospital Stay (HOSPITAL_COMMUNITY): Payer: Medicare HMO

## 2022-01-16 LAB — TYPE AND SCREEN
ABO/RH(D): O NEG
Antibody Screen: NEGATIVE
Unit division: 0
Unit division: 0
Unit division: 0
Unit division: 0
Unit division: 0
Unit division: 0
Unit division: 0

## 2022-01-16 LAB — BPAM RBC
Blood Product Expiration Date: 202309212359
Blood Product Expiration Date: 202309222359
Blood Product Expiration Date: 202309222359
Blood Product Expiration Date: 202309222359
Blood Product Expiration Date: 202309232359
Blood Product Expiration Date: 202309232359
Blood Product Expiration Date: 202309262359
ISSUE DATE / TIME: 202308241928
ISSUE DATE / TIME: 202308251816
ISSUE DATE / TIME: 202308252104
ISSUE DATE / TIME: 202308260352
Unit Type and Rh: 5100
Unit Type and Rh: 5100
Unit Type and Rh: 5100
Unit Type and Rh: 9500
Unit Type and Rh: 9500
Unit Type and Rh: 9500
Unit Type and Rh: 9500

## 2022-01-16 LAB — GLUCOSE, CAPILLARY
Glucose-Capillary: 141 mg/dL — ABNORMAL HIGH (ref 70–99)
Glucose-Capillary: 59 mg/dL — ABNORMAL LOW (ref 70–99)
Glucose-Capillary: 192 mg/dL — ABNORMAL HIGH (ref 70–99)
Glucose-Capillary: 133 mg/dL — ABNORMAL HIGH (ref 70–99)
Glucose-Capillary: 68 mg/dL — ABNORMAL LOW (ref 70–99)
Glucose-Capillary: 104 mg/dL — ABNORMAL HIGH (ref 70–99)
Glucose-Capillary: 76 mg/dL (ref 70–99)

## 2022-01-16 LAB — CBC
HCT: 30.5 % — ABNORMAL LOW (ref 36.0–46.0)
HCT: 31.4 % — ABNORMAL LOW (ref 36.0–46.0)
Hemoglobin: 10.2 g/dL — ABNORMAL LOW (ref 12.0–15.0)
Hemoglobin: 10.5 g/dL — ABNORMAL LOW (ref 12.0–15.0)
MCH: 28.5 pg (ref 26.0–34.0)
MCH: 28.7 pg (ref 26.0–34.0)
MCHC: 33.4 g/dL (ref 30.0–36.0)
MCHC: 33.4 g/dL (ref 30.0–36.0)
MCV: 85.3 fL (ref 80.0–100.0)
MCV: 85.9 fL (ref 80.0–100.0)
Platelets: 54 10*3/uL — ABNORMAL LOW (ref 150–400)
Platelets: 56 10*3/uL — ABNORMAL LOW (ref 150–400)
RBC: 3.55 MIL/uL — ABNORMAL LOW (ref 3.87–5.11)
RBC: 3.68 MIL/uL — ABNORMAL LOW (ref 3.87–5.11)
RDW: 17.9 % — ABNORMAL HIGH (ref 11.5–15.5)
RDW: 18 % — ABNORMAL HIGH (ref 11.5–15.5)
WBC: 11.3 10*3/uL — ABNORMAL HIGH (ref 4.0–10.5)
WBC: 12.1 10*3/uL — ABNORMAL HIGH (ref 4.0–10.5)
nRBC: 4.3 % — ABNORMAL HIGH (ref 0.0–0.2)
nRBC: 4.5 % — ABNORMAL HIGH (ref 0.0–0.2)

## 2022-01-16 LAB — RENAL FUNCTION PANEL
Albumin: 2 g/dL — ABNORMAL LOW (ref 3.5–5.0)
Albumin: 2 g/dL — ABNORMAL LOW (ref 3.5–5.0)
Anion gap: 7 (ref 5–15)
Anion gap: 11 (ref 5–15)
BUN: 17 mg/dL (ref 8–23)
BUN: 13 mg/dL (ref 8–23)
CO2: 24 mmol/L (ref 22–32)
CO2: 18 mmol/L — ABNORMAL LOW (ref 22–32)
Calcium: 7 mg/dL — ABNORMAL LOW (ref 8.9–10.3)
Calcium: 7.2 mg/dL — ABNORMAL LOW (ref 8.9–10.3)
Chloride: 107 mmol/L (ref 98–111)
Chloride: 110 mmol/L (ref 98–111)
Creatinine, Ser: 1.11 mg/dL — ABNORMAL HIGH (ref 0.44–1.00)
Creatinine, Ser: 1.03 mg/dL — ABNORMAL HIGH (ref 0.44–1.00)
GFR, Estimated: 51 mL/min — ABNORMAL LOW (ref >60)
GFR, Estimated: 55 mL/min — ABNORMAL LOW (ref >60)
Glucose, Bld: 146 mg/dL — ABNORMAL HIGH (ref 70–99)
Glucose, Bld: 66 mg/dL — ABNORMAL LOW (ref 70–99)
Phosphorus: 2.9 mg/dL (ref 2.5–4.6)
Phosphorus: 4 mg/dL (ref 2.5–4.6)
Potassium: 4.7 mmol/L (ref 3.5–5.1)
Potassium: 5 mmol/L (ref 3.5–5.1)
Sodium: 138 mmol/L (ref 135–145)
Sodium: 139 mmol/L (ref 135–145)

## 2022-01-16 LAB — COMPREHENSIVE METABOLIC PANEL
ALT: 73 U/L — ABNORMAL HIGH (ref 0–44)
AST: 250 U/L — ABNORMAL HIGH (ref 15–41)
Albumin: 2.1 g/dL — ABNORMAL LOW (ref 3.5–5.0)
Alkaline Phosphatase: 148 U/L — ABNORMAL HIGH (ref 38–126)
Anion gap: 6 (ref 5–15)
BUN: 19 mg/dL (ref 8–23)
CO2: 24 mmol/L (ref 22–32)
Calcium: 7.1 mg/dL — ABNORMAL LOW (ref 8.9–10.3)
Chloride: 107 mmol/L (ref 98–111)
Creatinine, Ser: 1.24 mg/dL — ABNORMAL HIGH (ref 0.44–1.00)
GFR, Estimated: 44 mL/min — ABNORMAL LOW (ref >60)
Glucose, Bld: 145 mg/dL — ABNORMAL HIGH (ref 70–99)
Potassium: 4.9 mmol/L (ref 3.5–5.1)
Sodium: 137 mmol/L (ref 135–145)
Total Bilirubin: 2.6 mg/dL — ABNORMAL HIGH (ref 0.3–1.2)
Total Protein: 4.1 g/dL — ABNORMAL LOW (ref 6.5–8.1)

## 2022-01-16 LAB — MAGNESIUM
Magnesium: 2.4 mg/dL (ref 1.7–2.4)
Magnesium: 2.5 mg/dL — ABNORMAL HIGH (ref 1.7–2.4)

## 2022-01-16 LAB — CREATININE, FLUID (PLEURAL, PERITONEAL, JP DRAINAGE): Creat, Fluid: 1.2 mg/dL

## 2022-01-16 LAB — PHOSPHORUS: Phosphorus: 3.2 mg/dL (ref 2.5–4.6)

## 2022-01-16 LAB — TRIGLYCERIDES: Triglycerides: 129 mg/dL (ref <150)

## 2022-01-16 MED ORDER — ALBUMIN HUMAN 5 % IV SOLN
25.0000 g | Freq: Once | INTRAVENOUS | Status: AC
  Administered 2022-01-16: 25 g via INTRAVENOUS

## 2022-01-16 MED ORDER — DEXTROSE 50 % IV SOLN
25.0000 mL | Freq: Once | INTRAVENOUS | Status: AC
Start: 2022-01-16 — End: 2022-01-16
  Administered 2022-01-16: 25 mL via INTRAVENOUS
  Filled 2022-01-16: qty 50

## 2022-01-16 MED ORDER — VITAL 1.5 CAL PO LIQD
1000.0000 mL | ORAL | Status: DC
  Administered 2022-01-16: 1000 mL

## 2022-01-16 MED ORDER — VITAMIN D 25 MCG (1000 UNIT) PO TABS
1000.0000 [IU] | ORAL_TABLET | Freq: Every day | ORAL | Status: DC
  Administered 2022-01-16 – 2022-01-17 (×2): 1000 [IU]
  Filled 2022-01-16 (×2): qty 1

## 2022-01-16 MED ORDER — ALBUMIN HUMAN 5 % IV SOLN
INTRAVENOUS | Status: AC
  Filled 2022-01-16: qty 500

## 2022-01-16 MED ORDER — NOREPINEPHRINE 16 MG/250ML-% IV SOLN
0.0000 ug/min | INTRAVENOUS | Status: DC
  Administered 2022-01-16: 15 ug/min via INTRAVENOUS
  Administered 2022-01-16: 21 ug/min via INTRAVENOUS
  Administered 2022-01-17: 40 ug/min via INTRAVENOUS
  Filled 2022-01-16 (×3): qty 250

## 2022-01-16 NOTE — Progress Notes (Signed)
Patient ID: Kendra Harper, female   DOB: 1940/08/01, 81 y.o.   MRN: [id number] BP 98/63   Pulse (!) 109   Temp 97.9 F (36.6 C)   Resp 16   Ht '5\' 1"'$  (1.549 m)   Wt 57.2 kg   SpO2 93%   BMI 23.83 kg/m  Intubated, sedated Eyes pinpoint No real neurological concerns Metabolic derangements more likely etiology for mental status change.
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
## 2022-01-16 NOTE — Progress Notes (Signed)
PT Cancellation Note  Patient Details Name: Kendra Harper MRN: [id number] DOB: 1940/12/29   Cancelled Treatment:    Reason Eval/Treat Not Completed: Patient not medically ready; she remains intubated and sedated.  Will sign off and anticipate new orders when stable.   Reginia Naas 01/16/2022, 1:54 PM Magda Kiel, PT Acute Rehabilitation Services [phone] 01/16/2022

## 2022-01-16 NOTE — Progress Notes (Signed)
Pt not tolerating SBT at this time due to low RR, low min ventilation, and hypertension. Pt placed back on full vent support, pt tolerating well at this, RN aware, MD aware, RT will monitor.

## 2022-01-16 NOTE — Progress Notes (Signed)
Blood pressures soft (sys upper 80's-low 90's) consistently. Pressor requirement increasing. Not pulling off fluid at this time via CRRT. Keeping pt even. MD notified and order for albumin given verbally. MD also made aware of pt's second low blood sugar of the day requiring dextrose.
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
## 2022-01-16 NOTE — Progress Notes (Signed)
Intubated, sedated.  Scant urine output via foley Continues to produce fluid per wound manager.   Abdomen, soft, mildly distended, ATTP; inferior portion leaking ss fluid. Probed and fascia feels intact. Drain stripped.  Irrigated foley with return of clear fluid. No clots. Able to withdraw full 20m after instilling 647m  JP creatinine pending. Re-ordered STAT. If lab says will be send out lab, discussed with nursing to send courier to [facility] run STAT.  If evidence of urine leak with JP creatinine, will plan for CT cystogram tomorrow AM.   MaCatalina Harper. [facility]  Pager: [phone]

## 2022-01-16 NOTE — Progress Notes (Signed)
OT Cancellation Note  Patient Details Name: Kendra Harper MRN: [id number] DOB: November 08, 1940   Cancelled Treatment:    Reason Eval/Treat Not Completed: Medical issues which prohibited therapy (Pt continues to have medical complexity that is not appropriate for therapy at this time. OT to sign off, please re-order when pt is medically ready. Thank you.)  Kendra Harper 01/16/2022, 3:42 PM
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
## 2022-01-16 NOTE — Progress Notes (Signed)
Orthopaedic Trauma Progress Note  SUBJECTIVE: Intubated and sedated.  OBJECTIVE:  Vitals:   01/16/22 0700 01/16/22 0800  BP: 128/82 (!) 109/50  Pulse: (!) 105 (!) 101  Resp: 18 17  Temp: 98.6 F (37 C) 98.4 F (36.9 C)  SpO2: 94% 93%    General: Intubated and sedated Respiratory: No increased work of breathing on the vent.  Pelvis/left lower extremity: Well-padded, well fitting short leg splint in place to left lower extremity.  Pelvic incisions clean, dry, intact.  Toes cool but equal contralateral side.  Dopplerable DP pulse.  Unable to precipitate in motor or sensory exam  IMAGING: Stable post op imaging.   LABS:  Results for orders placed or performed during the hospital encounter of 01/11/2022 (from the past 24 hour(s))  Culture, Respiratory w Gram Stain     Status: None (Preliminary result)   Collection Time: 01/15/22  9:01 AM   Specimen: Tracheal Aspirate; Respiratory  Result Value Ref Range   Specimen Description TRACHEAL ASPIRATE    Special Requests NONE    Gram Stain      MODERATE GRAM NEGATIVE RODS FEW GRAM POSITIVE RODS FEW GRAM POSITIVE COCCI IN PAIRS NO WBC SEEN Performed at [facility], [place] [address] [place], [place] [address]    Culture PENDING    Report Status PENDING   Glucose, capillary     Status: Abnormal   Collection Time: 01/15/22 10:59 AM  Result Value Ref Range   Glucose-Capillary 120 (H) 70 - 99 mg/dL  Renal function panel (daily at 1600)     Status: Abnormal   Collection Time: 01/15/22  2:59 PM  Result Value Ref Range   Sodium 138 135 - 145 mmol/L   Potassium 4.8 3.5 - 5.1 mmol/L   Chloride 108 98 - 111 mmol/L   CO2 23 22 - 32 mmol/L   Glucose, Bld 118 (H) 70 - 99 mg/dL   BUN 24 (H) 8 - 23 mg/dL   Creatinine, Ser 1.29 (H) 0.44 - 1.00 mg/dL   Calcium 6.8 (L) 8.9 - 10.3 mg/dL   Phosphorus 3.3 2.5 - 4.6 mg/dL   Albumin 2.3 (L) 3.5 - 5.0 g/dL   GFR, Estimated 42 (L) >60 mL/min   Anion gap 7 5 - 15  Glucose, capillary     Status:  Abnormal   Collection Time: 01/15/22  3:19 PM  Result Value Ref Range   Glucose-Capillary 112 (H) 70 - 99 mg/dL  Glucose, capillary     Status: Abnormal   Collection Time: 01/15/22  7:27 PM  Result Value Ref Range   Glucose-Capillary 107 (H) 70 - 99 mg/dL  Glucose, capillary     Status: Abnormal   Collection Time: 01/15/22 11:30 PM  Result Value Ref Range   Glucose-Capillary 141 (H) 70 - 99 mg/dL  Comprehensive metabolic panel     Status: Abnormal   Collection Time: 01/15/22 11:50 PM  Result Value Ref Range   Sodium 137 135 - 145 mmol/L   Potassium 4.9 3.5 - 5.1 mmol/L   Chloride 107 98 - 111 mmol/L   CO2 24 22 - 32 mmol/L   Glucose, Bld 145 (H) 70 - 99 mg/dL   BUN 19 8 - 23 mg/dL   Creatinine, Ser 1.24 (H) 0.44 - 1.00 mg/dL   Calcium 7.1 (L) 8.9 - 10.3 mg/dL   Total Protein 4.1 (L) 6.5 - 8.1 g/dL   Albumin 2.1 (L) 3.5 - 5.0 g/dL   AST 250 (H) 15 - 41  U/L   ALT 73 (H) 0 - 44 U/L   Alkaline Phosphatase 148 (H) 38 - 126 U/L   Total Bilirubin 2.6 (H) 0.3 - 1.2 mg/dL   GFR, Estimated 44 (L) >60 mL/min   Anion gap 6 5 - 15  CBC     Status: Abnormal   Collection Time: 01/15/22 11:50 PM  Result Value Ref Range   WBC 11.3 (H) 4.0 - 10.5 K/uL   RBC 3.68 (L) 3.87 - 5.11 MIL/uL   Hemoglobin 10.5 (L) 12.0 - 15.0 g/dL   HCT 31.4 (L) 36.0 - 46.0 %   MCV 85.3 80.0 - 100.0 fL   MCH 28.5 26.0 - 34.0 pg   MCHC 33.4 30.0 - 36.0 g/dL   RDW 18.0 (H) 11.5 - 15.5 %   Platelets 56 (L) 150 - 400 K/uL   nRBC 4.3 (H) 0.0 - 0.2 %  Magnesium     Status: Abnormal   Collection Time: 01/15/22 11:50 PM  Result Value Ref Range   Magnesium 2.5 (H) 1.7 - 2.4 mg/dL  Phosphorus     Status: None   Collection Time: 01/15/22 11:50 PM  Result Value Ref Range   Phosphorus 3.2 2.5 - 4.6 mg/dL  Glucose, capillary     Status: Abnormal   Collection Time: 01/16/22  3:20 AM  Result Value Ref Range   Glucose-Capillary 141 (H) 70 - 99 mg/dL  CBC     Status: Abnormal   Collection Time: 01/16/22  5:00 AM   Result Value Ref Range   WBC 12.1 (H) 4.0 - 10.5 K/uL   RBC 3.55 (L) 3.87 - 5.11 MIL/uL   Hemoglobin 10.2 (L) 12.0 - 15.0 g/dL   HCT 30.5 (L) 36.0 - 46.0 %   MCV 85.9 80.0 - 100.0 fL   MCH 28.7 26.0 - 34.0 pg   MCHC 33.4 30.0 - 36.0 g/dL   RDW 17.9 (H) 11.5 - 15.5 %   Platelets 54 (L) 150 - 400 K/uL   nRBC 4.5 (H) 0.0 - 0.2 %  Triglycerides     Status: None   Collection Time: 01/16/22  5:00 AM  Result Value Ref Range   Triglycerides 129 <150 mg/dL  Renal function panel (daily at 0500)     Status: Abnormal   Collection Time: 01/16/22  5:00 AM  Result Value Ref Range   Sodium 138 135 - 145 mmol/L   Potassium 4.7 3.5 - 5.1 mmol/L   Chloride 107 98 - 111 mmol/L   CO2 24 22 - 32 mmol/L   Glucose, Bld 146 (H) 70 - 99 mg/dL   BUN 17 8 - 23 mg/dL   Creatinine, Ser 1.11 (H) 0.44 - 1.00 mg/dL   Calcium 7.0 (L) 8.9 - 10.3 mg/dL   Phosphorus 2.9 2.5 - 4.6 mg/dL   Albumin 2.0 (L) 3.5 - 5.0 g/dL   GFR, Estimated 51 (L) >60 mL/min   Anion gap 7 5 - 15  Magnesium     Status: None   Collection Time: 01/16/22  5:00 AM  Result Value Ref Range   Magnesium 2.4 1.7 - 2.4 mg/dL  Glucose, capillary     Status: Abnormal   Collection Time: 01/16/22  7:16 AM  Result Value Ref Range   Glucose-Capillary 59 (L) 70 - 99 mg/dL  Glucose, capillary     Status: Abnormal   Collection Time: 01/16/22  7:52 AM  Result Value Ref Range   Glucose-Capillary 192 (H) 70 - 99 mg/dL    ASSESSMENT:  Kendra Harper is a 81 y.o. female, 3 Days Post-Op  PERCUTANEOUS FIXATION OF PELVIS OPEN REDUCTION INTERNAL FIXATION  LEFT ANKLE  CV/Blood loss: Hemoglobin 10.2 this morning, stable.    PLAN: Weightbearing: WBAT RLE.  TDWB LLE ROM: Unrestricted motion bilateral hips/knees Incisional and dressing care: LLE splint left in place until follow-up Showering: Bed bath Orthopedic device(s): Splint LLE Pain management: Per trauma VTE prophylaxis:  Heparin , SCDs ID: Perioperative ABX completed.  Currently on  ceftriaxone for CAP  Foley/Lines: Catheter in place.  Continue IVFs per trauma team Impediments to Fracture Healing: Vitamin D level 25, started on supplementation Dispo: Patient currently intubated and sedated.  Will need therapies once able.  Follow - up plan:  TBD   Contact information:  Katha Hamming MD, Rushie Nyhan PA-C. After hours and holidays please check [email or web address] for group call information for [facility]   Gwinda Passe, PA-C [phone] (office) [email or web address]
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
## 2022-01-17 ENCOUNTER — Inpatient Hospital Stay (HOSPITAL_COMMUNITY): Payer: Medicare HMO

## 2022-01-17 LAB — RENAL FUNCTION PANEL
Albumin: 2.6 g/dL — ABNORMAL LOW (ref 3.5–5.0)
Anion gap: 16 — ABNORMAL HIGH (ref 5–15)
BUN: 13 mg/dL (ref 8–23)
CO2: 16 mmol/L — ABNORMAL LOW (ref 22–32)
Calcium: 7.4 mg/dL — ABNORMAL LOW (ref 8.9–10.3)
Chloride: 108 mmol/L (ref 98–111)
Creatinine, Ser: 1.07 mg/dL — ABNORMAL HIGH (ref 0.44–1.00)
GFR, Estimated: 53 mL/min — ABNORMAL LOW (ref >60)
Glucose, Bld: 54 mg/dL — ABNORMAL LOW (ref 70–99)
Phosphorus: 3.9 mg/dL (ref 2.5–4.6)
Potassium: 5.1 mmol/L (ref 3.5–5.1)
Sodium: 140 mmol/L (ref 135–145)

## 2022-01-17 LAB — GLUCOSE, CAPILLARY
Glucose-Capillary: 109 mg/dL — ABNORMAL HIGH (ref 70–99)
Glucose-Capillary: 132 mg/dL — ABNORMAL HIGH (ref 70–99)
Glucose-Capillary: 175 mg/dL — ABNORMAL HIGH (ref 70–99)
Glucose-Capillary: 40 mg/dL — CL (ref 70–99)
Glucose-Capillary: 51 mg/dL — ABNORMAL LOW (ref 70–99)
Glucose-Capillary: 63 mg/dL — ABNORMAL LOW (ref 70–99)
Glucose-Capillary: 70 mg/dL (ref 70–99)

## 2022-01-17 LAB — PATHOLOGIST SMEAR REVIEW

## 2022-01-17 LAB — CBC
HCT: 29.2 % — ABNORMAL LOW (ref 36.0–46.0)
Hemoglobin: 9.3 g/dL — ABNORMAL LOW (ref 12.0–15.0)
MCH: 28.5 pg (ref 26.0–34.0)
MCHC: 31.8 g/dL (ref 30.0–36.0)
MCV: 89.6 fL (ref 80.0–100.0)
Platelets: 46 10*3/uL — ABNORMAL LOW (ref 150–400)
RBC: 3.26 MIL/uL — ABNORMAL LOW (ref 3.87–5.11)
RDW: 18.5 % — ABNORMAL HIGH (ref 11.5–15.5)
WBC: 21 10*3/uL — ABNORMAL HIGH (ref 4.0–10.5)
nRBC: 15.1 % — ABNORMAL HIGH (ref 0.0–0.2)

## 2022-01-17 LAB — CULTURE, RESPIRATORY W GRAM STAIN

## 2022-01-17 LAB — CALCIUM, IONIZED: Calcium, Ionized, Serum: 4.2 mg/dL — ABNORMAL LOW (ref 4.5–5.6)

## 2022-01-17 LAB — MAGNESIUM: Magnesium: 2.9 mg/dL — ABNORMAL HIGH (ref 1.7–2.4)

## 2022-01-17 MED ORDER — PHENYLEPHRINE HCL-NACL 20-0.9 MG/250ML-% IV SOLN
INTRAVENOUS | Status: AC
  Filled 2022-01-17: qty 250

## 2022-01-17 MED ORDER — DEXTROSE 50 % IV SOLN
25.0000 mL | Freq: Once | INTRAVENOUS | Status: AC
  Administered 2022-01-17: 25 mL via INTRAVENOUS

## 2022-01-17 MED ORDER — DEXTROSE 50 % IV SOLN: 50.0000 mL | INTRAVENOUS | Status: AC

## 2022-01-17 MED ORDER — GLYCOPYRROLATE 1 MG PO TABS: 1.0000 mg | ORAL_TABLET | ORAL | Status: DC | PRN

## 2022-01-17 MED ORDER — DEXTROSE 50 % IV SOLN
INTRAVENOUS | Status: AC
  Administered 2022-01-17: 50 mL via INTRAVENOUS
  Filled 2022-01-17: qty 50

## 2022-01-17 MED ORDER — DEXTROSE 50 % IV SOLN
INTRAVENOUS | Status: AC
  Filled 2022-01-17: qty 50

## 2022-01-17 MED ORDER — INSULIN GLARGINE-YFGN 100 UNIT/ML ~~LOC~~ SOLN
5.0000 [IU] | Freq: Every day | SUBCUTANEOUS | Status: DC
Start: 2022-01-17 — End: 2022-01-17
  Filled 2022-01-17: qty 0.05

## 2022-01-17 MED ORDER — ALBUMIN HUMAN 5 % IV SOLN
12.5000 g | Freq: Once | INTRAVENOUS | Status: AC
Start: 2022-01-17 — End: 2022-01-17
  Administered 2022-01-17: 12.5 g via INTRAVENOUS
  Filled 2022-01-17: qty 250

## 2022-01-17 MED ORDER — POLYVINYL ALCOHOL 1.4 % OP SOLN: 1.0000 [drp] | Freq: Four times a day (QID) | OPHTHALMIC | Status: DC | PRN

## 2022-01-17 MED ORDER — SODIUM CHLORIDE 0.9 % IV SOLN: INTRAVENOUS | Status: DC

## 2022-01-17 MED ORDER — SODIUM ZIRCONIUM CYCLOSILICATE 10 G PO PACK
10.0000 g | PACK | Freq: Once | ORAL | Status: AC
  Administered 2022-01-17: 10 g
  Filled 2022-01-17: qty 1

## 2022-01-17 MED ORDER — DEXTROSE 50 % IV SOLN
INTRAVENOUS | Status: AC
  Administered 2022-01-17: 25 mL
  Filled 2022-01-17: qty 50

## 2022-01-17 MED ORDER — INSULIN ASPART 100 UNIT/ML IJ SOLN: 0.0000 [IU] | INTRAMUSCULAR | Status: DC

## 2022-01-17 MED ORDER — GLYCOPYRROLATE 0.2 MG/ML IJ SOLN
0.2000 mg | INTRAMUSCULAR | Status: DC | PRN
  Filled 2022-01-17: qty 1

## 2022-01-17 MED ORDER — PHENYLEPHRINE 80 MCG/ML (10ML) SYRINGE FOR IV PUSH (FOR BLOOD PRESSURE SUPPORT)
PREFILLED_SYRINGE | INTRAVENOUS | Status: AC
  Administered 2022-01-17: 40 ug
  Filled 2022-01-17: qty 10

## 2022-01-17 MED ORDER — ACETAMINOPHEN 325 MG PO TABS: 650.0000 mg | ORAL_TABLET | Freq: Four times a day (QID) | ORAL | Status: DC | PRN

## 2022-01-17 MED ORDER — PHENYLEPHRINE HCL-NACL 20-0.9 MG/250ML-% IV SOLN
0.0000 ug/min | INTRAVENOUS | Status: DC
  Administered 2022-01-17: 30 ug/min via INTRAVENOUS

## 2022-01-17 MED ORDER — GLYCOPYRROLATE 0.2 MG/ML IJ SOLN: 0.2000 mg | INTRAMUSCULAR | Status: DC | PRN

## 2022-01-17 MED ORDER — ACETAMINOPHEN 650 MG RE SUPP: 650.0000 mg | Freq: Four times a day (QID) | RECTAL | Status: DC | PRN

## 2022-01-20 NOTE — Progress Notes (Signed)
Patient ID: Kendra Harper, female   DOB: 07/15/1940, 81 y.o.   MRN: [id number] BP remains low despite high dose pressors. No neuro improvement. I spoke with her family again and they have elected to go to comfort care as soon as some more family members see her. This is very reasonable and I agree. Georganna Skeans, MD, MPH, FACS Please use [email or web address] to contact on call provider

## 2022-01-20 NOTE — Progress Notes (Signed)
Pt placed on 400 mcg Fentanyl per MD order for comfort measures. Pt extubated at 1205. Pressors weaned per comfort care protocol and pt asystolic at 9373. Cardiac death confirmed by self and Eli Phillips, RN. Family at bedside and aware of pt's death.

## 2022-01-20 NOTE — Progress Notes (Signed)
0745: CBG 40. 1 amp D50 given per protocol. Recheck CBG 175  0840: Pt with episode of hypotension. SBP 60s-70s. Dr. Grandville Silos notified. VO received for 40 mcg Neo IVP and start Neo gtt. Pt responded well. Decision made by Dr. Grandville Silos to cease CRRT until pt stabilizes. Blood returned to pt.  1100: Pt continues to require increasing amounts of Neo. Family aware of pt's worsening condition. Dr. Grandville Silos notified of concerns and at bedside. Decision made by family to transition to comfort care.

## 2022-01-20 NOTE — Progress Notes (Signed)
RT NOTE: Patient extubated per order and family wishes to comfort care. Family is at bedside with patient.

## 2022-01-20 NOTE — Progress Notes (Signed)
200 mL Fentanyl wasted with Normajean Baxter, RN

## 2022-01-20 NOTE — Progress Notes (Signed)
BP dropped to 90s/40s. Levo increased from from 28 to 30. BP increased to 200s immediately. Levo was dropped back down to 28. Pressure slowly decreased.  On assessment, patient had no cough, gag or corneal. Pupils remain 2 round and sluggish. TRN and Dr. Justine Null notified. Order for stat head CT. Patient disconnected from CRRT for CT.   On return to 4N25, patient was reconnected to CRRT without complications. VSS.

## 2022-01-20 NOTE — Progress Notes (Signed)
Dictation on: 01-31-2022 10:40 AM by: Ashok Pall [[id number]]
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
## 2022-01-20 NOTE — Progress Notes (Signed)
Pt transported to and from CT without event.  
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
## 2022-01-20 NOTE — Progress Notes (Signed)
Trauma Event Note    Called to bedside by primary RN after change in BPs and decline in reflexes. BP dropped to the 90s, primary RN to bedside to titrate levophed up and BP shot up to 767H systolic then dropped back down to the 90s. No cough/gag reflex, very weak corneal reflexes. Dr. Rosendo Gros notified, verbal order for stat head CT. Disconnected from CRRT and escorted down to CT with primary RN and RT.   Last imported Vital Signs BP (!) 95/54   Pulse (!) 109   Temp 98.2 F (36.8 C)   Resp (!) 24   Ht '5\' 1"'$  (1.549 m)   Wt 122 lb 2.2 oz (55.4 kg)   SpO2 93%   BMI 23.08 kg/m   Trending CBC Recent Labs    01/15/22 2350 01/16/22 0500 14-Feb-2022 0510  WBC 11.3* 12.1* 21.0*  HGB 10.5* 10.2* 9.3*  HCT 31.4* 30.5* 29.2*  PLT 56* 54* 46*    Trending Coag's Recent Labs    01/14/22 1820 01/15/22 0417  APTT  --  40*  INR 2.1* 1.9*    Trending BMET Recent Labs    01/16/22 0500 01/16/22 1516 02/14/22 0510  NA 138 139 140  K 4.7 5.0 5.1  CL 107 110 108  CO2 24 18* 16*  BUN '17 13 13  '$ CREATININE 1.11* 1.03* 1.07*  GLUCOSE 146* 66* 54*      Lula Michaux O Phuong Moffatt  Trauma Response RN  Please call TRN at [phone] for further assistance.
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
## 2022-01-20 NOTE — Inpatient Diabetes Management (Signed)
Inpatient Diabetes Program Recommendations  AACE/ADA: New Consensus Statement on Inpatient Glycemic Control (2015)  Target Ranges:  Prepandial:   less than 140 mg/dL      Peak postprandial:   less than 180 mg/dL (1-2 hours)      Critically ill patients:  140 - 180 mg/dL   Lab Results  Component Value Date   GLUCAP 175 (H) 01-19-22   HGBA1C 5.9 (H) 12/20/2021    Review of Glycemic Control  Latest Reference Range & Units 2022/01/19 05:23 01-19-2022 05:52 01-19-2022 07:39 01/19/22 08:07  Glucose-Capillary 70 - 99 mg/dL 51 (L) 109 (H) 40 (LL) 175 (H)  (LL): Data is critically low  Diabetes history: Type 1 DM Outpatient Diabetes medications: Novolog via pump- 12a 0.35, 2a 0.45, 7a 0.35. I:C 17. ISF 66.5. Target 120. Total: 40 units/day 40 mL  Current orders for Inpatient glycemic control: Novolog 0-9 units Q4H, Semglee 10 units QD  Inpatient Diabetes Program Recommendations:   Noted multiple episode of hypoglycemia. Consider further decreasing Semglee 5 units QD and decreasing correction to Novolog 0-6 units Q4H.  Thanks, Bronson Curb, MSN, RNC-OB Diabetes Coordinator [phone] (8a-5p)

## 2022-01-20 DEATH — deceased

## 2022-01-26 LAB — CREATININE, BODY FLUID OTHER: Creatinine, Body Fluid: 0.9 mg/dL
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
## 2022-03-30 ENCOUNTER — Other Ambulatory Visit (HOSPITAL_COMMUNITY): Payer: Medicare HMO
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
## 2022-12-28 IMAGING — CR DG HUMERUS 2V *L*
2 series · 2 of 2 positions shown · non-contrast
Comparison: None.

CLINICAL DATA: Fall

EXAM:
LEFT HUMERUS - 2+ VIEW

[humerus ap]
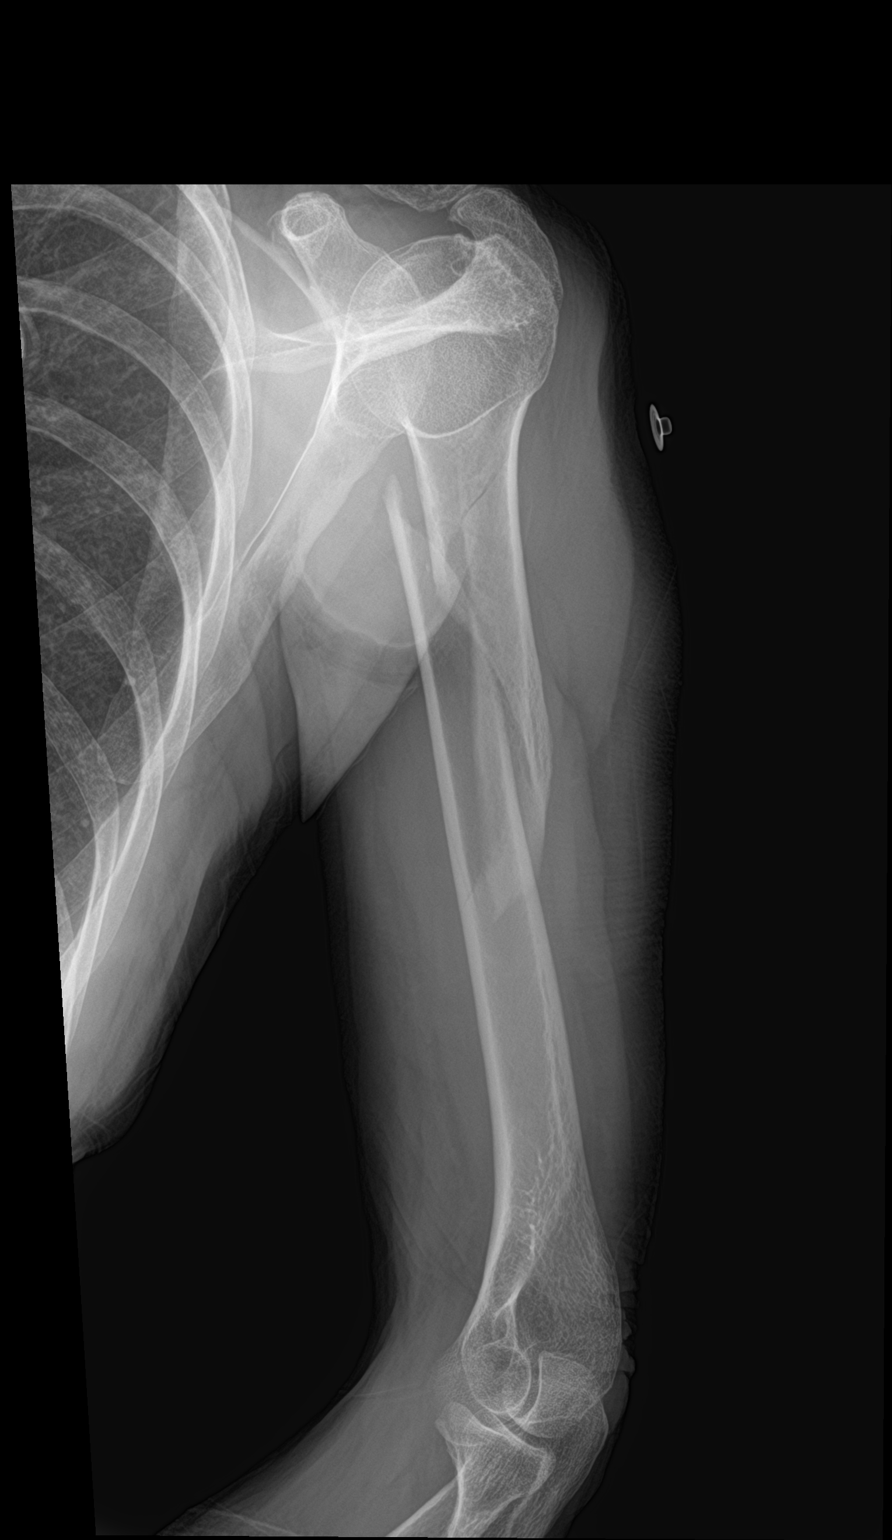

[humerus lat]
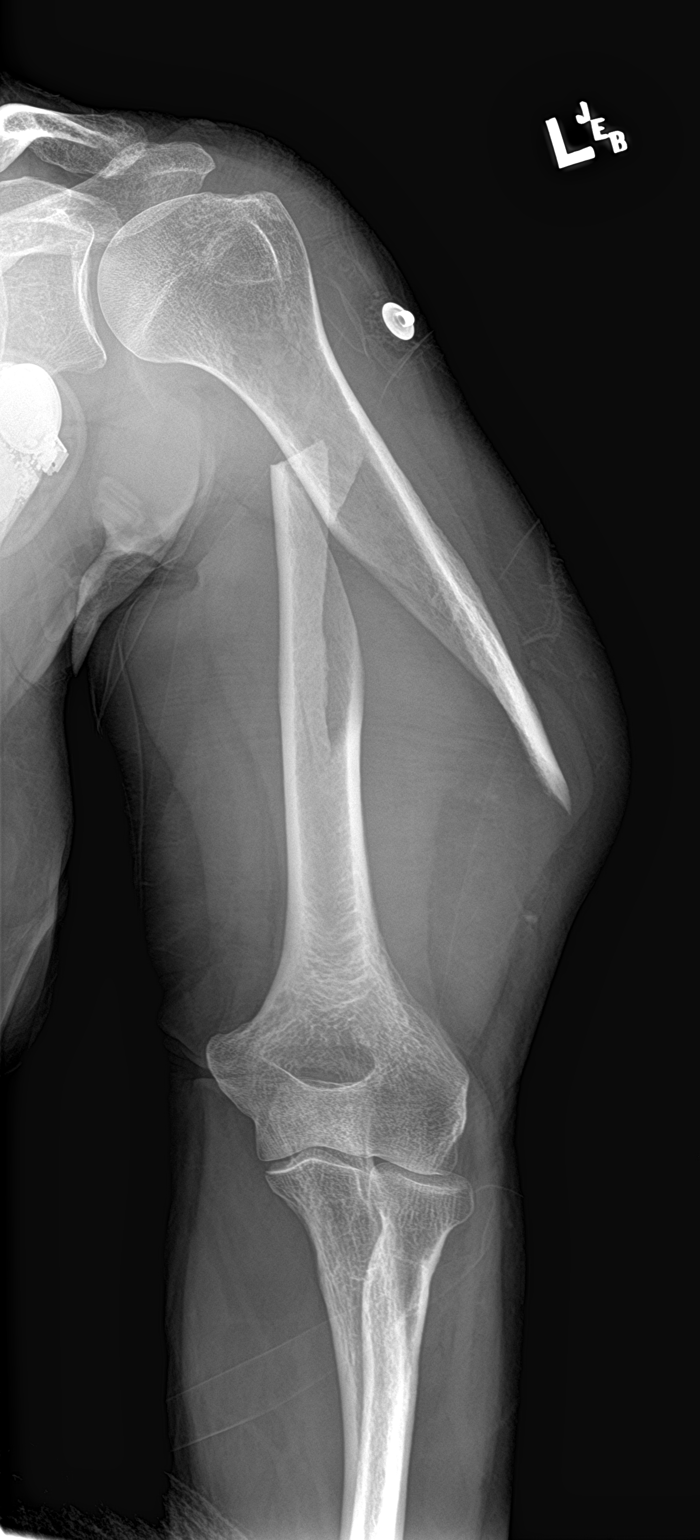

[2 of 2 positions shown; findings below may reference images not displayed]

FINDINGS: Spiral fracture mid shaft of humerus with moderate angulation and
[DATE] shaft width of displacement. Normal shoulder and elbow
alignment.
IMPRESSION: Spiral fracture mid shaft of humerus with angulation and
displacement.

## 2023-09-01 IMAGING — CR DG FOOT 2V*R*
2 series · 2 of 2 positions shown · non-contrast
Comparison: None.

CLINICAL DATA: Pain and swelling dorsal aspect 3rd and 4th MTP
joint area. Initial encounter

EXAM:
RIGHT FOOT - 2 VIEW

[x foot ap right]
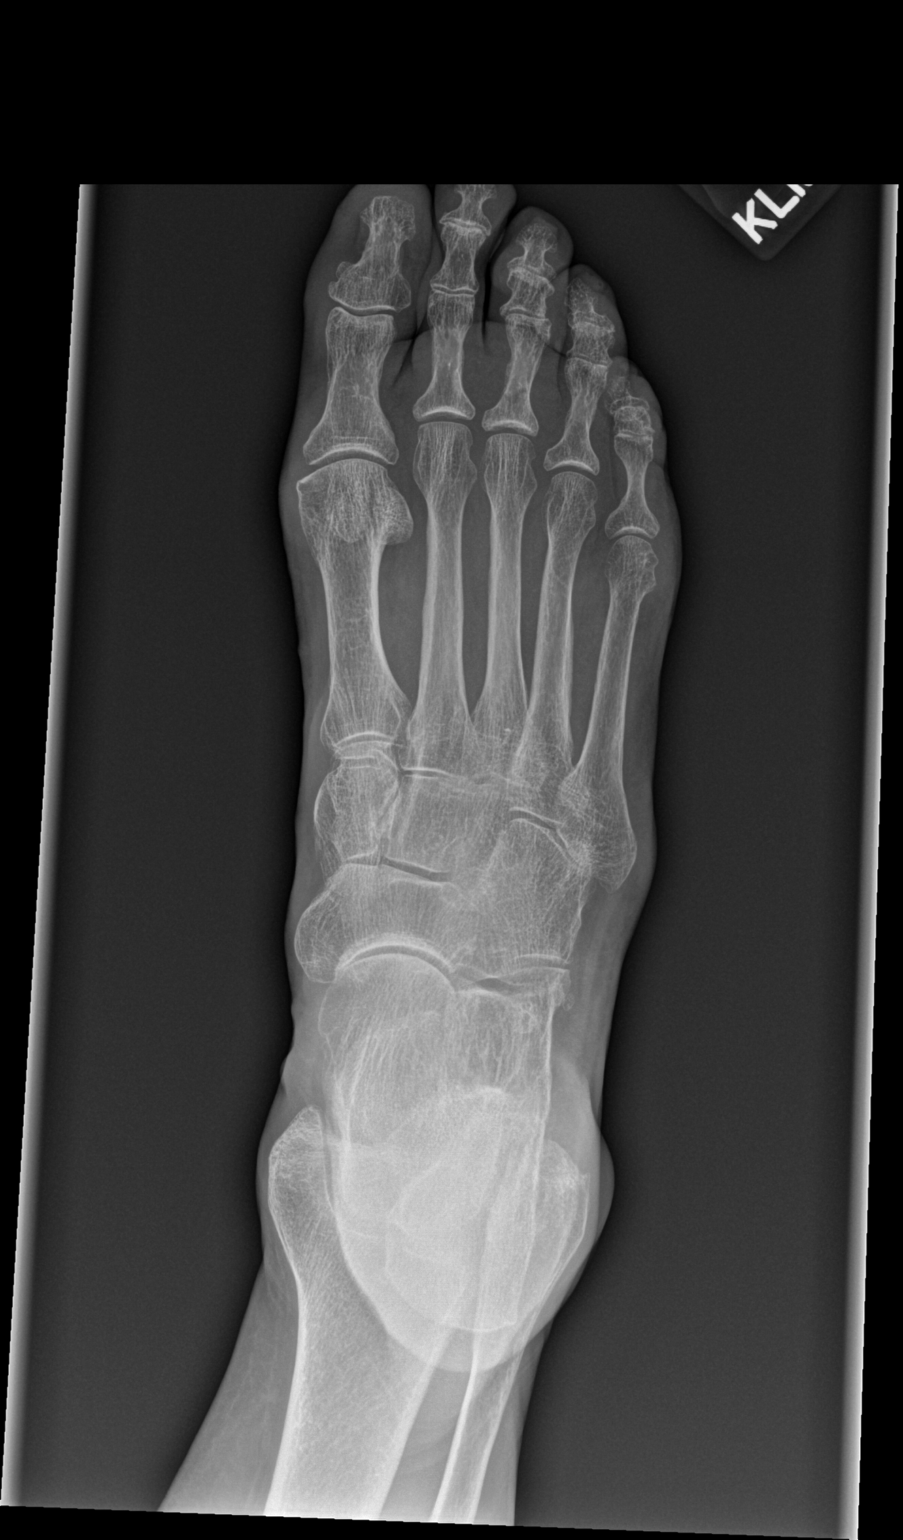

[x foot lat right]
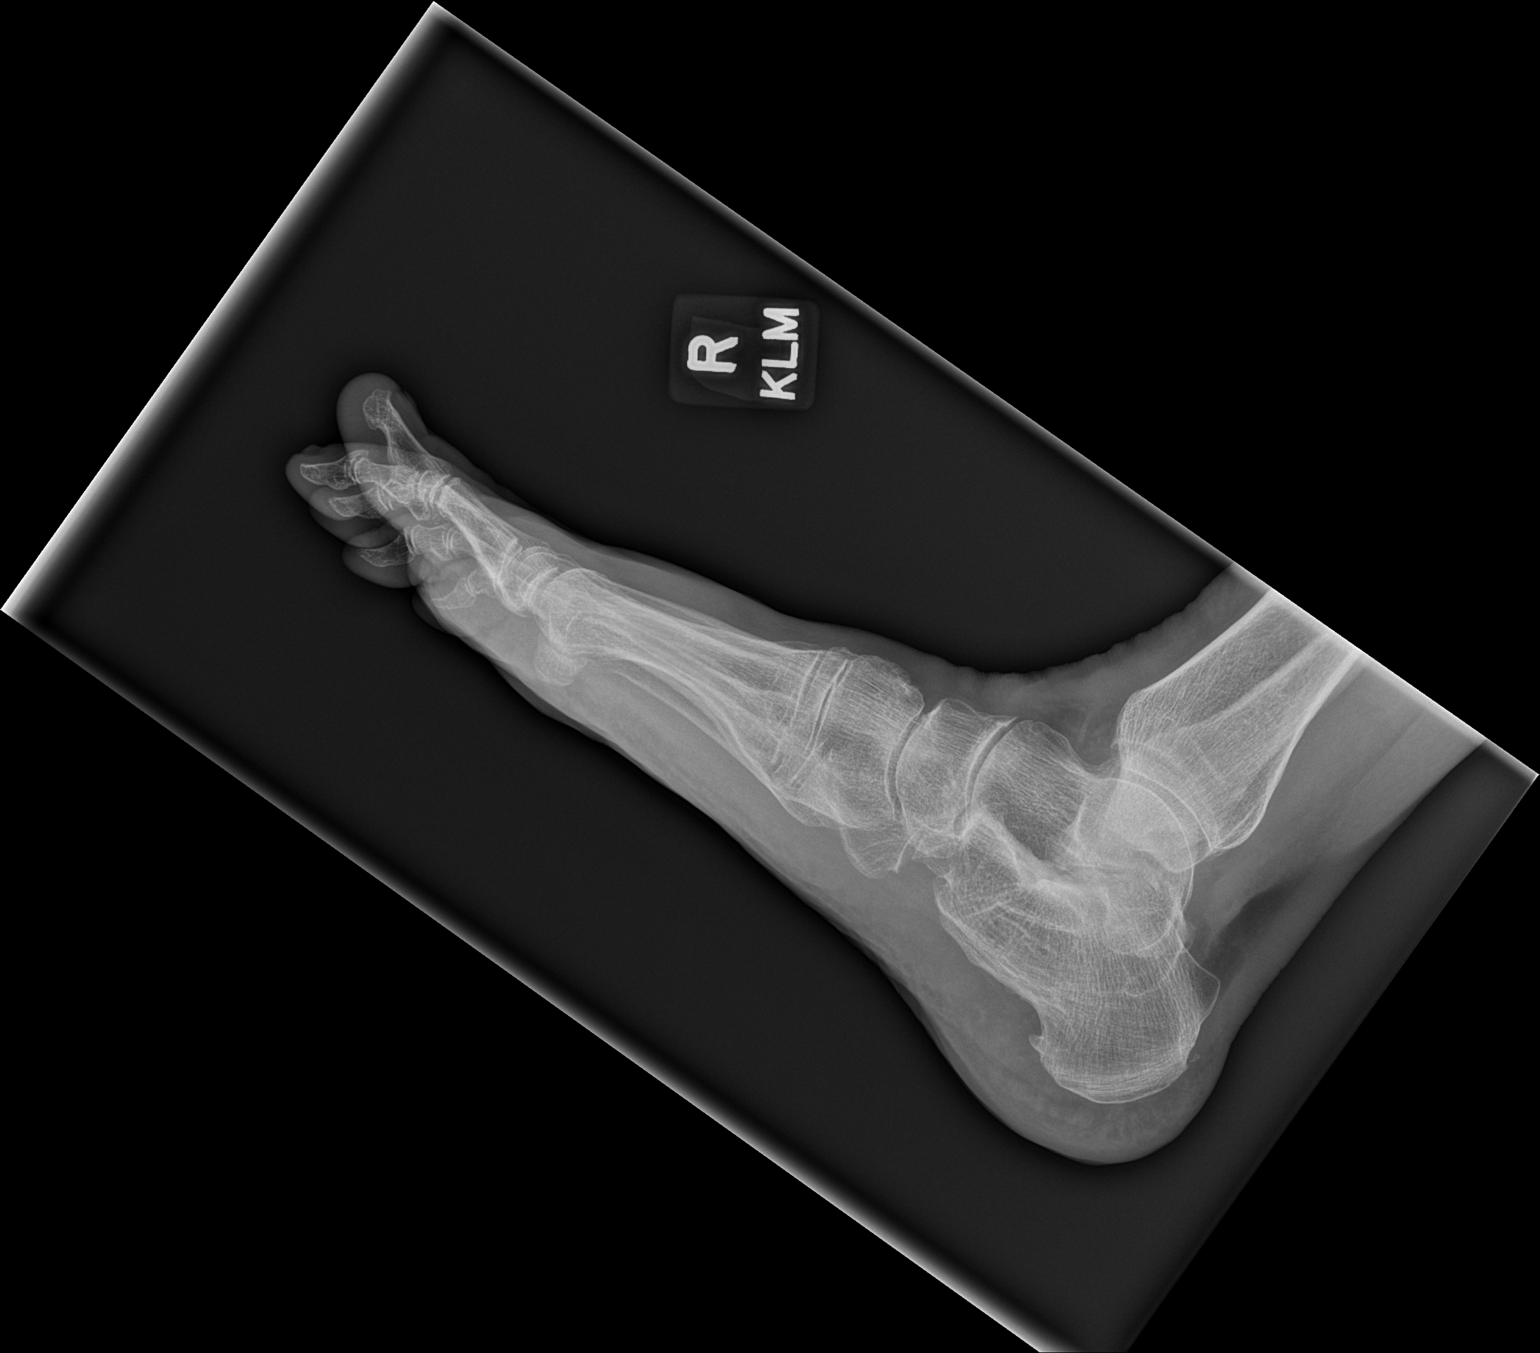

[2 of 2 positions shown; findings below may reference images not displayed]

FINDINGS: There is no evidence of fracture or dislocation. No soft tissue
abnormality is noted. Flattening of the plantar arch is noted.
Calcaneal spurring is seen.
IMPRESSION: No acute abnormality is noted.
